# Patient Record
Sex: Male | Born: 1985 | Race: White | Hispanic: No | Marital: Married | Smoking: Current every day smoker
Health system: Southern US, Community
[De-identification: ages and names within clinical notes are randomized; demographics above are authoritative.]

## PROBLEM LIST (undated history)

## (undated) DIAGNOSIS — F419 Anxiety disorder, unspecified: Secondary | ICD-10-CM

## (undated) DIAGNOSIS — G252 Other specified forms of tremor: Secondary | ICD-10-CM

## (undated) DIAGNOSIS — K5792 Diverticulitis of intestine, part unspecified, without perforation or abscess without bleeding: Secondary | ICD-10-CM

## (undated) HISTORY — PX: DENTAL SURGERY: SHX609

---
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
## 2014-01-28 ENCOUNTER — Encounter (HOSPITAL_COMMUNITY): Payer: Self-pay | Admitting: Emergency Medicine

## 2014-01-28 ENCOUNTER — Emergency Department (HOSPITAL_COMMUNITY)
Admission: EM | Admit: 2014-01-28 | Discharge: 2014-01-28 | Disposition: A | Discharge status: Home / Self Care | Payer: PRIVATE HEALTH INSURANCE | Attending: Emergency Medicine | Admitting: Emergency Medicine

## 2014-01-28 DIAGNOSIS — F17200 Nicotine dependence, unspecified, uncomplicated: Secondary | ICD-10-CM | POA: Diagnosis not present

## 2014-01-28 DIAGNOSIS — K029 Dental caries, unspecified: Secondary | ICD-10-CM | POA: Diagnosis not present

## 2014-01-28 DIAGNOSIS — K089 Disorder of teeth and supporting structures, unspecified: Secondary | ICD-10-CM | POA: Diagnosis present

## 2014-01-28 DIAGNOSIS — Z8659 Personal history of other mental and behavioral disorders: Secondary | ICD-10-CM | POA: Insufficient documentation

## 2014-01-28 DIAGNOSIS — Z8669 Personal history of other diseases of the nervous system and sense organs: Secondary | ICD-10-CM | POA: Insufficient documentation

## 2014-01-28 DIAGNOSIS — K047 Periapical abscess without sinus: Secondary | ICD-10-CM

## 2014-01-28 HISTORY — DX: Anxiety disorder, unspecified: F41.9

## 2014-01-28 HISTORY — DX: Other specified forms of tremor: G25.2

## 2014-01-28 MED ORDER — CLINDAMYCIN HCL 150 MG PO CAPS: 150.0000 mg | ORAL_CAPSULE | Freq: Four times a day (QID) | ORAL | Status: DC

## 2014-01-28 MED ORDER — HYDROCODONE-ACETAMINOPHEN 5-325 MG PO TABS: 1.0000 | ORAL_TABLET | ORAL | Status: DC | PRN

## 2014-01-28 NOTE — ED Notes (Signed)
Dental pain for 2 days,lt upper wisdom tooth hurts

## 2014-01-28 NOTE — Discharge Instructions (Signed)
Complete your entire course of antibiotics as prescribed.  You  may use the hydrocodone for pain relief but do not drive within 4 hours of taking as this will make you drowsy.  Avoid applying heat or ice to this abscess area which can worsen your symptoms.  You may use warm salt water swish and spit treatment or half peroxide and water swish and spit after meals to keep this area clean as discussed.  Call the dentist listed above for further management of your symptoms.  

## 2014-01-28 NOTE — ED Provider Notes (Signed)
CSN: [id number]     Arrival date & time 01/28/14  1237 History   This chart was scribed for non-physician practitioner working with Burgess AmorJulie Taequan Stockhausen, by Tana ConchStephen Methvin ED Scribe. This patient was seen in [facility] and the patient's care was started at 3:51 PM.    Chief Complaint  Patient presents with  . Dental Pain     The history is provided by the patient. No language interpreter was used.    HPI Comments: Jake Walsh is a 28 y.o. male who presents to the Emergency Department complaining of a 2 day history of shooting dental pain caused by his upper left wisdom tooth. He states that the pain radiates up to his right temple. Both hot and cold cause him pain. Pt reports that his gums are swollen. He denies fever and chills. Pt states that NSAIDS have provided no effect. Current everyday smoker.  Past Medical History  Diagnosis Date  . Anxiety   . Coarse tremors    History reviewed. No pertinent past surgical history. History reviewed. No pertinent family history. History  Substance Use Topics  . Smoking status: Current Every Day Smoker  . Smokeless tobacco: Not on file  . Alcohol Use: No    Review of Systems  Constitutional: Negative for fever.  HENT: Positive for dental problem. Negative for facial swelling and sore throat.   Respiratory: Negative for shortness of breath.   Musculoskeletal: Negative for neck pain and neck stiffness.  All other systems reviewed and are negative.     Allergies  Review of patient's allergies indicates no known allergies.  Home Medications   Prior to Admission medications   Not on File   BP 144/72  Pulse 90  Temp(Src) 98 F (36.7 C) (Oral)  Resp 18  Ht 5\' 10"  (1.778 m)  Wt 180 lb (81.647 kg)  BMI 25.83 kg/m2  SpO2 99% Physical Exam  Nursing note and vitals reviewed. Constitutional: He is oriented to person, place, and time. He appears well-developed and well-nourished. No distress.  HENT:  Head: Normocephalic and  atraumatic.  Right Ear: Tympanic membrane and external ear normal.  Left Ear: Tympanic membrane and external ear normal.  Mouth/Throat: Oropharynx is clear and moist and mucous membranes are normal. No oral lesions.  Upper Left   Dental Infection Left upper molar has a large cavity, mild surrounding gingival edema. No fluctuance or drainage.  Eyes: Conjunctivae are normal.  Neck: Normal range of motion. Neck supple.  Cardiovascular: Normal rate and normal heart sounds.   Pulmonary/Chest: Effort normal.  Abdominal: He exhibits no distension.  Musculoskeletal: Normal range of motion.  Lymphadenopathy:    He has no cervical adenopathy.  Neurological: He is alert and oriented to person, place, and time.  Skin: Skin is warm and dry. No erythema.  Psychiatric: He has a normal mood and affect.    ED Course  Procedures (including critical care time)  DIAGNOSTIC STUDIES: Oxygen Saturation is 99% on RA, normal by my interpretation.    COORDINATION OF CARE:   4:00 PM-Discussed treatment plan which includes antibiotics and pain medication with pt at bedside and pt agreed to plan.   Labs Review Labs Reviewed - No data to display  Imaging Review No results found.   EKG Interpretation None      MDM   Final diagnoses:  Dental infection   I personally performed the services described in this documentation, which was scribed in my presence. The recorded information has been reviewed and is accurate.  Dental infection with probable early abscess.  Clindamycin, hydrocodone prescribed.  Dental referrals given.      Burgess AmorJulie Sheala Dosh, PA-C 01/30/14 1651

## 2014-01-31 NOTE — ED Provider Notes (Signed)
Medical screening examination/treatment/procedure(s) were performed by non-physician practitioner and as supervising physician I was immediately available for consultation/collaboration.   EKG Interpretation None        Benny LennertJoseph L Khan Chura, MD 01/31/14 [phone]

## 2020-07-04 ENCOUNTER — Encounter: Payer: Self-pay | Admitting: Emergency Medicine

## 2020-07-04 ENCOUNTER — Ambulatory Visit
Admission: EM | Admit: 2020-07-04 | Discharge: 2020-07-04 | Disposition: A | Discharge status: Home / Self Care | Payer: BC Managed Care – PPO | Attending: Emergency Medicine | Admitting: Emergency Medicine

## 2020-07-04 ENCOUNTER — Ambulatory Visit
Admission: RE | Admit: 2020-07-04 | Discharge: 2020-07-04 | Discharge status: Absent without leave | Payer: BC Managed Care – PPO | Source: Ambulatory Visit

## 2020-07-04 ENCOUNTER — Other Ambulatory Visit: Payer: Self-pay

## 2020-07-04 DIAGNOSIS — M778 Other enthesopathies, not elsewhere classified: Secondary | ICD-10-CM | POA: Diagnosis not present

## 2020-07-04 MED ORDER — ACETAMINOPHEN 500 MG PO TABS: 500.0000 mg | ORAL_TABLET | Freq: Four times a day (QID) | ORAL | 0 refills | Status: DC | PRN

## 2020-07-04 MED ORDER — PREDNISONE 10 MG PO TABS: 20.0000 mg | ORAL_TABLET | Freq: Every day | ORAL | 0 refills | Status: DC

## 2020-07-04 MED ORDER — PREDNISONE 10 MG (21) PO TBPK: ORAL_TABLET | ORAL | 0 refills | Status: DC

## 2020-07-04 NOTE — ED Triage Notes (Addendum)
RT hand pain and swelling that started x 2 days ago.  Denies any injury

## 2020-07-04 NOTE — ED Provider Notes (Signed)
[facility]   [id number] 07/04/20 Arrival Time: 1227   Chief Complaint  Patient presents with  . Hand Pain      SUBJECTIVE: History from: patient.  Jake Walsh is a 34 y.o. male who presents to the urgent care for complaint of right hand pain for the past 3 days. Denies any precipitating event, trauma or injury. Report he played video games where he excessively use his thumb on the controller. He localizes the pain to the right  thumb.  He describes the pain as constant and achy.  He has tried OTC medications without relief.  His symptoms are made worse with ROM.  He denies similar symptoms in the past. Denies chills, fever, nausea, vomiting, diarrhea  ROS: As per HPI.  All other pertinent ROS negative.     Past Medical History:  Diagnosis Date  . Anxiety   . Coarse tremors    History reviewed. No pertinent surgical history. No Known Allergies No current facility-administered medications on file prior to encounter.   Current Outpatient Medications on File Prior to Encounter  Medication Sig Dispense Refill  . clindamycin (CLEOCIN) 150 MG capsule Take 1 capsule (150 mg total) by mouth every 6 (six) hours. 28 capsule 0  . HYDROcodone-acetaminophen (NORCO/VICODIN) 5-325 MG per tablet Take 1 tablet by mouth every 4 (four) hours as needed for moderate pain. 15 tablet 0   Social History   Socioeconomic History  . Marital status: Single    Spouse name: Not on file  . Number of children: Not on file  . Years of education: Not on file  . Highest education level: Not on file  Occupational History  . Not on file  Tobacco Use  . Smoking status: Current Every Day Smoker    Packs/day: 0.50    Types: Cigarettes  . Smokeless tobacco: Never Used  Substance and Sexual Activity  . Alcohol use: No  . Drug use: No  . Sexual activity: Not on file  Other Topics Concern  . Not on file  Social History Narrative  . Not on file   Social Determinants of Health    Financial Resource Strain:   . Difficulty of Paying Living Expenses: Not on file  Food Insecurity:   . Worried About Programme researcher, broadcasting/film/video in the Last Year: Not on file  . Ran Out of Food in the Last Year: Not on file  Transportation Needs:   . Lack of Transportation (Medical): Not on file  . Lack of Transportation (Non-Medical): Not on file  Physical Activity:   . Days of Exercise per Week: Not on file  . Minutes of Exercise per Session: Not on file  Stress:   . Feeling of Stress : Not on file  Social Connections:   . Frequency of Communication with Friends and Family: Not on file  . Frequency of Social Gatherings with Friends and Family: Not on file  . Attends Religious Services: Not on file  . Active Member of Clubs or Organizations: Not on file  . Attends Banker Meetings: Not on file  . Marital Status: Not on file  Intimate Partner Violence:   . Fear of Current or Ex-Partner: Not on file  . Emotionally Abused: Not on file  . Physically Abused: Not on file  . Sexually Abused: Not on file   No family history on file.  OBJECTIVE:  Vitals:   07/04/20 1241  BP: 125/85  Pulse: 68  Resp: 17  Temp: 98.3 F (  36.8 C)  TempSrc: Oral  SpO2: 98%  Weight: 196 lb (88.9 kg)  Height: 5\' 10"  (1.778 m)     Physical Exam Vitals and nursing note reviewed.  Constitutional:      General: He is not in acute distress.    Appearance: Normal appearance. He is normal weight. He is not ill-appearing, toxic-appearing or diaphoretic.  Cardiovascular:     Rate and Rhythm: Normal rate and regular rhythm.     Pulses: Normal pulses.     Heart sounds: Normal heart sounds. No murmur heard.  No friction rub. No gallop.   Pulmonary:     Effort: Pulmonary effort is normal. No respiratory distress.     Breath sounds: Normal breath sounds. No stridor. No wheezing, rhonchi or rales.  Chest:     Chest wall: No tenderness.  Musculoskeletal:     Right hand: Tenderness present.      Left hand: Normal.     Comments: The right hand is without obvious asymmetry or deformity when compared to the left hand. No surface trauma, ecchymosis, open wound, laceration, warmth, nail avulsion, subungual hematoma present. Normal range of motion with pain. Neurovascular status intact.  Neurological:     Mental Status: He is alert and oriented to person, place, and time.     LABS:  No results found for this or any previous visit (from the past 24 hour(s)).   ASSESSMENT & PLAN:  1. Tendinitis of thumb     Meds ordered this encounter  Medications  . DISCONTD: predniSONE (DELTASONE) 10 MG tablet    Sig: Take 2 tablets (20 mg total) by mouth daily.    Dispense:  15 tablet    Refill:  0  . predniSONE (STERAPRED UNI-PAK 21 TAB) 10 MG (21) TBPK tablet    Sig: Take 6 tabs by mouth daily  for 1 days, then 5 tabs for 1 days, then 4 tabs for 1 days, then 3 tabs for 1 days, 2 tabs for 1 days, then 1 tab by mouth daily for 1 days    Dispense:  21 tablet    Refill:  0  . acetaminophen (TYLENOL) 500 MG tablet    Sig: Take 1 tablet (500 mg total) by mouth every 6 (six) hours as needed.    Dispense:  30 tablet    Refill:  0    Discharge instructions  Take Tylenol as prescribed for pain Prednisone was prescribed/take as directed Follow RICE instruction that is attached Use wrist splint Follow-up with PCP Return or go to ED for worsening of symptoms  Reviewed expectations re: course of current medical issues. Questions answered. Outlined signs and symptoms indicating need for more acute intervention. Patient verbalized understanding. After Visit Summary given.         , FNP 07/04/20 1323

## 2020-07-04 NOTE — Discharge Instructions (Signed)
Take Tylenol as prescribed for pain Prednisone was prescribed/take as directed Follow RICE instruction that is attached Use wrist splint Follow-up with PCP Return or go to ED for worsening of symptoms

## 2020-07-26 DIAGNOSIS — K0889 Other specified disorders of teeth and supporting structures: Secondary | ICD-10-CM | POA: Diagnosis not present

## 2020-07-26 DIAGNOSIS — Z6828 Body mass index (BMI) 28.0-28.9, adult: Secondary | ICD-10-CM | POA: Diagnosis not present

## 2020-07-28 DIAGNOSIS — R6889 Other general symptoms and signs: Secondary | ICD-10-CM | POA: Diagnosis not present

## 2020-07-28 DIAGNOSIS — K529 Noninfective gastroenteritis and colitis, unspecified: Secondary | ICD-10-CM | POA: Diagnosis not present

## 2020-07-28 DIAGNOSIS — B349 Viral infection, unspecified: Secondary | ICD-10-CM | POA: Diagnosis not present

## 2020-07-28 DIAGNOSIS — U071 COVID-19: Secondary | ICD-10-CM | POA: Diagnosis not present

## 2020-07-28 DIAGNOSIS — F17210 Nicotine dependence, cigarettes, uncomplicated: Secondary | ICD-10-CM | POA: Diagnosis not present

## 2020-10-30 DIAGNOSIS — Z23 Encounter for immunization: Secondary | ICD-10-CM | POA: Diagnosis not present

## 2020-11-07 ENCOUNTER — Ambulatory Visit (INDEPENDENT_AMBULATORY_CARE_PROVIDER_SITE_OTHER): Payer: BC Managed Care – PPO

## 2020-11-07 ENCOUNTER — Ambulatory Visit: Payer: BC Managed Care – PPO | Admitting: Nurse Practitioner

## 2020-11-07 ENCOUNTER — Other Ambulatory Visit: Payer: Self-pay

## 2020-11-07 ENCOUNTER — Encounter: Payer: Self-pay | Admitting: Nurse Practitioner

## 2020-11-07 VITALS — BP 121/71 | HR 86 | Temp 97.4°F | Ht 70.0 in | Wt 203.2 lb

## 2020-11-07 DIAGNOSIS — M25562 Pain in left knee: Secondary | ICD-10-CM

## 2020-11-07 MED ORDER — IBUPROFEN 600 MG PO TABS: 600.0000 mg | ORAL_TABLET | Freq: Three times a day (TID) | ORAL | 0 refills | Status: DC | PRN

## 2020-11-07 MED ORDER — PREDNISONE 10 MG (21) PO TBPK: ORAL_TABLET | ORAL | 0 refills | Status: DC

## 2020-11-07 NOTE — Assessment & Plan Note (Signed)
Patient is a 35 year old male who is in clinic today establishing care and reporting left knee pain.  Symptoms in the last 3 days have worsened.  Patient is not reporting loss of sensation tingling or numbness.  Patient describes pain as aching/shooting at times.  He rates pain 6 out of 10 on the pain scale of 0-10.  Patient refused Depo-Medrol shot in clinic, he is willing to start ibuprofen 600 mg as needed for pain, prednisone taper, brace on affected knee and ice pack. Advised patient to follow-up with worsening or unresolved symptoms.  The plan is to refer to physical therapy/orthopedic in the future. X-ray of the left knee completed results pending. Rx sent to pharmacy.

## 2020-11-07 NOTE — Patient Instructions (Addendum)
Ibuprofen for pain, prednisone pack taper, immobilize knee, ice compress. Plan is to follow-up with physical therapy if pain is worsened or unresolved.  Acute Knee Pain, Adult Many things can cause knee pain. Sometimes, knee pain is sudden (acute) and may be caused by damage, swelling, or irritation of the muscles and tissues that support your knee. The pain often goes away on its own with time and rest. If the pain does not go away, tests may be done to find out what is causing the pain. Follow these instructions at home: If you have a knee sleeve or brace:  Wear the knee sleeve or brace as told by your doctor. Take it off only as told by your doctor.  Loosen it if your toes: ? Tingle. ? Become numb. ? Turn cold and blue.  Keep it clean.  If the knee sleeve or brace is not waterproof: ? Do not let it get wet. ? Cover it with a watertight covering when you take a bath or shower.   Activity  Rest your knee.  Do not do things that cause pain or make pain worse.  Avoid activities where both feet leave the ground at the same time (high-impact activities). Examples are running, jumping rope, and doing jumping jacks.  Work with a physical therapist to make a safe exercise program, as told by your doctor. Managing pain, stiffness, and swelling  If told, put ice on the knee. To do this: ? If you have a removable knee sleeve or brace, take it off as told by your doctor. ? Put ice in a plastic bag. ? Place a towel between your skin and the bag. ? Leave the ice on for 20 minutes, 2-3 times a day. ? Take off the ice if your skin turns bright red. This is very important. If you cannot feel pain, heat, or cold, you have a greater risk of damage to the area.  If told, use an elastic bandage to put pressure (compression) on your injured knee.  Raise your knee above the level of your heart while you are sitting or lying down.  Sleep with a pillow under your knee.   General  instructions  Take over-the-counter and prescription medicines only as told by your doctor.  Do not smoke or use any products that contain nicotine or tobacco. If you need help quitting, ask your doctor.  If you are overweight, work with your doctor and a food expert (dietitian) to set goals to lose weight. Being overweight can make your knee hurt more.  Watch for any changes in your symptoms.  Keep all follow-up visits. Contact a doctor if:  The knee pain does not stop.  The knee pain changes or gets worse.  You have a fever along with knee pain.  Your knee is red or feels warm when you touch it.  Your knee gives out or locks up. Get help right away if:  Your knee swells, and the swelling gets worse.  You cannot move your knee.  You have very bad knee pain that does not get better with pain medicine. Summary  Many things can cause knee pain. The pain often goes away on its own with time and rest.  Your doctor may do tests to find out the cause of the pain.  Watch for any changes in your symptoms. Relieve your pain with rest, medicines, light activity, and use of ice.  Get help right away if you cannot move your knee or your knee  pain is very bad. This information is not intended to replace advice given to you by your health care provider. Make sure you discuss any questions you have with your health care provider. Document Revised: 03/12/2020 Document Reviewed: 03/12/2020 Elsevier Patient Education  2021 [employer].

## 2020-11-07 NOTE — Progress Notes (Signed)
New Patient Note  RE: Jake Walsh MRN: [id number] DOB: Aug 03, 1986 Date of Office Visit: 11/07/2020  Chief Complaint: Establish Care  History of Present Illness:  Knee Pain  The incident occurred 3 to 5 days ago. The incident occurred in the yard. There was no injury mechanism. The pain is present in the left knee. The pain is at a severity of 6/10. The pain has been constant since onset. Pertinent negatives include no loss of motion, loss of sensation, muscle weakness, numbness or tingling. He reports no foreign bodies present. The symptoms are aggravated by movement. He has tried nothing for the symptoms.    Assessment and Plan:   Jake Walsh is a 35 y.o. male with: Acute pain of left knee Patient is a 35 year old male who is in clinic today establishing care and reporting left knee pain.  Symptoms in the last 3 days have worsened.  Patient is not reporting loss of sensation tingling or numbness.  Patient describes pain as aching/shooting at times.  He rates pain 6 out of 10 on the pain scale of 0-10.  Patient refused Depo-Medrol shot in clinic, he is willing to start ibuprofen 600 mg as needed for pain, prednisone taper, brace on affected knee and ice pack. Advised patient to follow-up with worsening or unresolved symptoms.  The plan is to refer to physical therapy/orthopedic in the future. X-ray of the left knee completed results pending. Rx sent to pharmacy.  Return if symptoms worsen or fail to improve.   Diagnostics:   Past Medical History: Patient Active Problem List   Diagnosis Date Noted  . Acute pain of left knee 11/07/2020   Past Medical History:  Diagnosis Date  . Anxiety   . Coarse tremors    Past Surgical History: History reviewed. No pertinent surgical history. Medication List:  Current Outpatient Medications  Medication Sig Dispense Refill  . ibuprofen (ADVIL) 600 MG tablet Take 1 tablet (600 mg total) by mouth every 8 (eight) hours as needed. 30 tablet 0   . predniSONE (STERAPRED UNI-PAK 21 TAB) 10 MG (21) TBPK tablet 6 tablets day 1, 5 tablets day 2, 4 tablets day 3, 3 tablets day , 2 tablets day 5, 1 tablet day 6 21 tablet 0   No current facility-administered medications for this visit.   Allergies: No Known Allergies Social History: Social History   Socioeconomic History  . Marital status: Single    Spouse name: Enrique Sack  . Number of children: 3  . Years of education: Not on file  . Highest education level: Some college, no degree  Occupational History  . Not on file  Tobacco Use  . Smoking status: Current Every Day Smoker    Packs/day: 0.25    Types: Cigarettes  . Smokeless tobacco: Never Used  Vaping Use  . Vaping Use: Every day  . Substances: Nicotine  Substance and Sexual Activity  . Alcohol use: No  . Drug use: No  . Sexual activity: Yes  Other Topics Concern  . Not on file  Social History Narrative  . Not on file   Social Determinants of Health   Financial Resource Strain: Not on file  Food Insecurity: Not on file  Transportation Needs: Not on file  Physical Activity: Not on file  Stress: Not on file  Social Connections: Not on file       Family History: Family History  Problem Relation Age of Onset  . Arthritis/Rheumatoid Mother   . Gout Mother   . Parkinson's  disease Father   . Diabetes Father   . Bipolar disorder Father   . Gallstones Sister   . Hodgkin's lymphoma Sister   . ADD / ADHD Son   . ODD Son          Review of Systems  Constitutional: Negative.   HENT: Negative.   Respiratory: Negative.   Cardiovascular: Negative.   Musculoskeletal:       Acute left knee pain  Skin: Negative.   Neurological: Negative for tingling, numbness and headaches.  All other systems reviewed and are negative.  Objective: BP 121/71   Pulse 86   Temp (!) 97.4 F (36.3 C)   Ht 5\' 10"  (1.778 m)   Wt 203 lb 3.2 oz (92.2 kg)   SpO2 100%   BMI 29.16 kg/m  Body mass index is 29.16 kg/m. Physical  Exam Vitals reviewed.  Constitutional:      Appearance: Normal appearance.  HENT:     Head: Normocephalic.     Nose: Nose normal.  Eyes:     Conjunctiva/sclera: Conjunctivae normal.  Cardiovascular:     Rate and Rhythm: Normal rate and regular rhythm.     Pulses: Normal pulses.     Heart sounds: Normal heart sounds.  Pulmonary:     Effort: Pulmonary effort is normal.     Breath sounds: Normal breath sounds.  Musculoskeletal:        General: Tenderness present.     Comments: Limited range of motion left knee.  Skin:    General: Skin is warm.  Neurological:     Mental Status: He is alert and oriented to person, place, and time.  Psychiatric:        Behavior: Behavior normal.    The plan was reviewed with the patient/family, and all questions/concerned were addressed.  It was my pleasure to see Jake Walsh today and participate in his care. Please feel free to contact me with any questions or concerns.  Sincerely,  Chrissie Noa NP [facility]

## 2020-11-28 DIAGNOSIS — Z23 Encounter for immunization: Secondary | ICD-10-CM | POA: Diagnosis not present

## 2021-02-06 ENCOUNTER — Encounter: Payer: Self-pay | Admitting: Nurse Practitioner

## 2021-02-06 ENCOUNTER — Other Ambulatory Visit: Payer: Self-pay

## 2021-02-06 ENCOUNTER — Ambulatory Visit (INDEPENDENT_AMBULATORY_CARE_PROVIDER_SITE_OTHER): Payer: BC Managed Care – PPO | Admitting: Nurse Practitioner

## 2021-02-06 VITALS — BP 135/87 | HR 84 | Temp 98.5°F | Ht 70.0 in | Wt 211.0 lb

## 2021-02-06 DIAGNOSIS — Z0001 Encounter for general adult medical examination with abnormal findings: Secondary | ICD-10-CM | POA: Diagnosis not present

## 2021-02-06 DIAGNOSIS — Z0000 Encounter for general adult medical examination without abnormal findings: Secondary | ICD-10-CM | POA: Insufficient documentation

## 2021-02-06 DIAGNOSIS — F17200 Nicotine dependence, unspecified, uncomplicated: Secondary | ICD-10-CM | POA: Insufficient documentation

## 2021-02-06 NOTE — Assessment & Plan Note (Signed)
Provided education for smoking cessation.  Patient is not ready to quit but will let provider know when he is ready.  Printed handouts given.

## 2021-02-06 NOTE — Patient Instructions (Signed)

## 2021-02-06 NOTE — Progress Notes (Signed)
Established Patient Office Visit  Subjective:  Patient ID: Jake Walsh, male    DOB: [birth year]  Age: 35 y.o. MRN: [id number]  CC:  Chief Complaint  Patient presents with  . Annual Exam  . Nicotine Dependence    HPI Jake Walsh presents for .   Encounter for general adult medical examination without abnormal findings  Physical: Patient's last physical exam was 2 year ago .  Weight: Appropriate for height (BMI greater than 27%) ;  Blood Pressure: Normal (BP less than 120/80) ; 135/87  Medical History: Patient history reviewed ; Family history reviewed ;  Allergies Reviewed: No change in current allergies ;  Medications Reviewed: Medications reviewed - no changes ;  Lipids: Normal lipid levels ; labs completed results pending Smoking: Life-long-smoker ; yes Physical Activity: Exercises at least 3 times per week ; waking at work Alcohol/Drug Use: alcohol once a while-drinker ; No illicit drug use ;  No   Safety: reviewed ; Patient wears a seat belt, has smoke detectors, has carbon monoxide detectors, practices appropriate gun safety, and wears sunscreen with extended sun exposure. No Dental Care: biannual cleanings,No  brushes and flosses daily. Yes Ophthalmology/Optometry: Annual visit.  Hearing loss: none Vision impairments: none  Past Medical History:  Diagnosis Date  . Anxiety   . Coarse tremors     History reviewed. No pertinent surgical history.  Family History  Problem Relation Age of Onset  . Arthritis/Rheumatoid Mother   . Gout Mother   . Arthritis Mother   . Parkinson's disease Father   . Diabetes Father   . Bipolar disorder Father   . Gallstones Sister   . Hodgkin's lymphoma Sister   . ADD / ADHD Son   . ODD Son     Social History   Socioeconomic History  . Marital status: Single    Spouse name: Jake Walsh  . Number of children: 3  . Years of education: Not on file  . Highest education level: Some college, no degree  Occupational History  .  Not on file  Tobacco Use  . Smoking status: Current Every Day Smoker    Packs/day: 0.25    Types: Cigarettes  . Smokeless tobacco: Never Used  Vaping Use  . Vaping Use: Every day  . Substances: Nicotine  Substance and Sexual Activity  . Alcohol use: No    Comment: occ  . Drug use: No  . Sexual activity: Yes    Birth control/protection: None  Other Topics Concern  . Not on file  Social History Narrative  . Not on file   Social Determinants of Health   Financial Resource Strain: Not on file  Food Insecurity: Not on file  Transportation Needs: Not on file  Physical Activity: Not on file  Stress: Not on file  Social Connections: Not on file  Intimate Partner Violence: Not on file    Outpatient Medications Prior to Visit  Medication Sig Dispense Refill  . ibuprofen (ADVIL) 600 MG tablet Take 1 tablet (600 mg total) by mouth every 8 (eight) hours as needed. 30 tablet 0  . predniSONE (STERAPRED UNI-PAK 21 TAB) 10 MG (21) TBPK tablet 6 tablets day 1, 5 tablets day 2, 4 tablets day 3, 3 tablets day , 2 tablets day 5, 1 tablet day 6 21 tablet 0   No facility-administered medications prior to visit.    No Known Allergies  ROS Review of Systems  Constitutional: Negative.   HENT: Negative.   Eyes: Negative.  Respiratory: Negative.   Cardiovascular: Negative.   Gastrointestinal: Negative.   Genitourinary: Negative.   Musculoskeletal: Negative.   Skin: Negative.   Neurological: Negative.   Psychiatric/Behavioral: Negative.   All other systems reviewed and are negative.     Objective:    Physical Exam Vitals and nursing note reviewed.  Constitutional:      Appearance: Normal appearance.  HENT:     Head: Normocephalic.     Right Ear: Ear canal and external ear normal. There is impacted cerumen.     Left Ear: Ear canal and external ear normal. There is impacted cerumen.     Nose: Nose normal.     Mouth/Throat:     Mouth: Mucous membranes are moist.     Pharynx:  Oropharynx is clear. No oropharyngeal exudate.  Eyes:     Conjunctiva/sclera: Conjunctivae normal.     Pupils: Pupils are equal, round, and reactive to light.  Cardiovascular:     Rate and Rhythm: Normal rate and regular rhythm.     Pulses: Normal pulses.     Heart sounds: Normal heart sounds.  Pulmonary:     Effort: Pulmonary effort is normal.     Breath sounds: Normal breath sounds.  Abdominal:     General: Bowel sounds are normal.  Musculoskeletal:     Cervical back: Normal range of motion.  Skin:    General: Skin is warm.     Findings: No rash.  Neurological:     Mental Status: He is alert and oriented to person, place, and time.  Psychiatric:        Mood and Affect: Mood is anxious.        Behavior: Behavior normal.        Thought Content: Thought content does not include suicidal ideation.     BP 135/87   Pulse 84   Temp 98.5 F (36.9 C) (Temporal)   Ht 5\' 10"  (1.778 m)   Wt 211 lb (95.7 kg)   SpO2 97%   BMI 30.28 kg/m  Wt Readings from Last 3 Encounters:  02/06/21 211 lb (95.7 kg)  11/07/20 203 lb 3.2 oz (92.2 kg)  07/04/20 196 lb (88.9 kg)     Health Maintenance Due  Topic Date Due  . Hepatitis C Screening  Never done  . HIV Screening  Never done  . TETANUS/TDAP  Never done  . COVID-19 Vaccine (2 - Pfizer 3-dose series) 11/21/2019    Flowsheet Row Office Visit from 02/06/2021 in 02/08/2021 Family Medicine  PHQ-9 Total Score 0     GAD 7 : Generalized Anxiety Score 02/06/2021  Nervous, Anxious, on Edge 1  Control/stop worrying 0  Worry too much - different things 0  Trouble relaxing 2  Restless 2  Easily annoyed or irritable 1  Afraid - awful might happen 0  Total GAD 7 Score 6      Assessment & Plan:   Problem List Items Addressed This Visit      Other   Annual physical exam - Primary    Completed head to toe assessment.  Provided education for preventative and health management.  Labs completed today CBC, CMP, HIV antibody,  hepatitis C antibody and lipid panel.  Results pending.   Follow-up in 1 year      Relevant Orders   CBC with Differential   Comprehensive metabolic panel   Lipid Panel   Hepatitis C antibody   HIV Antibody (routine testing w rflx)      No  orders of the defined types were placed in this encounter.   Follow-up: Return in about 1 year (around 02/06/2022).    Daryll Drown, NP

## 2021-02-06 NOTE — Assessment & Plan Note (Addendum)
Completed head to toe assessment.  Provided education for preventative and health management.  Labs completed today CBC, CMP, HIV antibody, hepatitis C antibody and lipid panel.  Results pending.   Follow-up in 1 year

## 2021-02-11 ENCOUNTER — Other Ambulatory Visit: Payer: Self-pay

## 2021-02-11 ENCOUNTER — Other Ambulatory Visit: Payer: BC Managed Care – PPO

## 2021-02-11 DIAGNOSIS — Z0000 Encounter for general adult medical examination without abnormal findings: Secondary | ICD-10-CM | POA: Diagnosis not present

## 2021-02-11 DIAGNOSIS — Z136 Encounter for screening for cardiovascular disorders: Secondary | ICD-10-CM | POA: Diagnosis not present

## 2021-02-11 LAB — CBC WITH DIFFERENTIAL/PLATELET
MCHC: 33.5 g/dL (ref 31.5–35.7)
Neutrophils Absolute: 4.5 10*3/uL (ref 1.4–7.0)
Platelets: 214 10*3/uL (ref 150–450)

## 2021-02-11 LAB — COMPREHENSIVE METABOLIC PANEL
Calcium: 9.7 mg/dL (ref 8.7–10.2)
Sodium: 141 mmol/L (ref 134–144)

## 2021-02-11 LAB — HEPATITIS C ANTIBODY

## 2021-02-11 LAB — LIPID PANEL

## 2021-02-12 LAB — CBC WITH DIFFERENTIAL/PLATELET
Basophils Absolute: 0.1 10*3/uL (ref 0.0–0.2)
Basos: 1 %
EOS (ABSOLUTE): 0.4 10*3/uL (ref 0.0–0.4)
Eos: 4 %
Hematocrit: 44.5 % (ref 37.5–51.0)
Hemoglobin: 14.9 g/dL (ref 13.0–17.7)
Immature Grans (Abs): 0 10*3/uL (ref 0.0–0.1)
Immature Granulocytes: 0 %
Lymphocytes Absolute: 2.6 10*3/uL (ref 0.7–3.1)
Lymphs: 32 %
MCH: 28.5 pg (ref 26.6–33.0)
MCV: 85 fL (ref 79–97)
Monocytes Absolute: 0.7 10*3/uL (ref 0.1–0.9)
Monocytes: 9 %
Neutrophils: 54 %
RBC: 5.23 x10E6/uL (ref 4.14–5.80)
RDW: 13.1 % (ref 11.6–15.4)
WBC: 8.3 10*3/uL (ref 3.4–10.8)

## 2021-02-12 LAB — COMPREHENSIVE METABOLIC PANEL
ALT: 19 IU/L (ref 0–44)
AST: 17 IU/L (ref 0–40)
Albumin/Globulin Ratio: 2.2 (ref 1.2–2.2)
Albumin: 4.7 g/dL (ref 4.0–5.0)
Alkaline Phosphatase: 73 IU/L (ref 44–121)
BUN: 11 mg/dL (ref 6–20)
Bilirubin Total: 0.5 mg/dL (ref 0.0–1.2)
CO2: 25 mmol/L (ref 20–29)
Chloride: 103 mmol/L (ref 96–106)
Creatinine, Ser: 0.83 mg/dL (ref 0.76–1.27)
Globulin, Total: 2.1 g/dL (ref 1.5–4.5)
Glucose: 98 mg/dL (ref 65–99)
Potassium: 5.2 mmol/L (ref 3.5–5.2)
Total Protein: 6.8 g/dL (ref 6.0–8.5)
eGFR: 119 mL/min/{1.73_m2} (ref >59)

## 2021-02-12 LAB — LIPID PANEL
Chol/HDL Ratio: 3.9 ratio (ref 0.0–5.0)
Cholesterol, Total: 193 mg/dL (ref 100–199)
LDL Chol Calc (NIH): 111 mg/dL — ABNORMAL HIGH (ref 0–99)
Triglycerides: 181 mg/dL — ABNORMAL HIGH (ref 0–149)
VLDL Cholesterol Cal: 32 mg/dL (ref 5–40)

## 2021-02-12 LAB — HIV ANTIBODY (ROUTINE TESTING W REFLEX): HIV Screen 4th Generation wRfx: NONREACTIVE

## 2021-03-13 ENCOUNTER — Ambulatory Visit: Payer: BC Managed Care – PPO | Admitting: Family Medicine

## 2021-03-13 ENCOUNTER — Other Ambulatory Visit: Payer: Self-pay

## 2021-03-13 ENCOUNTER — Encounter: Payer: Self-pay | Admitting: Family Medicine

## 2021-03-13 VITALS — BP 128/81 | HR 83 | Temp 98.2°F | Ht 70.0 in | Wt 216.0 lb

## 2021-03-13 DIAGNOSIS — S99921A Unspecified injury of right foot, initial encounter: Secondary | ICD-10-CM

## 2021-03-13 DIAGNOSIS — R1319 Other dysphagia: Secondary | ICD-10-CM

## 2021-03-13 MED ORDER — OMEPRAZOLE 20 MG PO CPDR: 20.0000 mg | DELAYED_RELEASE_CAPSULE | Freq: Every day | ORAL | 3 refills | Status: DC

## 2021-03-13 NOTE — Progress Notes (Signed)
Assessment & Plan:  1. Esophageal dysphagia Started omeprazole. Discussed if symptoms do not resolve that he may want to see a gastroenterologist.  - omeprazole (PRILOSEC) 20 MG capsule; Take 1 capsule (20 mg total) by mouth daily.  Dispense: 30 capsule; Refill: 3  2. Injury of right foot, initial encounter Reassurance provided that his foot laceration is only superficial and there is not s/s of infection. Keep clean and dry. Advised to cover if wearing shoes that leave area exposed.    Follow up plan: Return if symptoms worsen or fail to improve.  Deliah Boston, MSN, APRN, FNP-C [facility]  Subjective:   Patient ID: Jake Walsh, male    DOB: [birth year], 35 y.o.   MRN: [id number]  HPI: Jake Walsh is a 35 y.o. male presenting on 03/13/2021 for trouble swallowing  (X 2 days when he tries to swallow food it feels like it gets stuck ) and Fall (Patient had a fall in the shower x 1 week ago and stepped on a nail )  Patient reports for the past two days when he swallows, food is getting stuck in his throat. He chews food thoroughly. Denies reflux, heartburn, and burping. No trouble breathing. He has not experienced this before.   Patient would also like his right foot looked at (per wife's request) as he fell in the shower a week ago and hit his heel on the screw from the drain.   ROS: Negative unless specifically indicated above in HPI.   Relevant past medical history reviewed and updated as indicated.   Allergies and medications reviewed and updated.  No current outpatient medications on file.  No Known Allergies  Objective:   BP 128/81   Pulse 83   Temp 98.2 F (36.8 C) (Temporal)   Ht 5\' 10"  (1.778 m)   Wt 216 lb (98 kg)   SpO2 98%   BMI 30.99 kg/m    Physical Exam Vitals reviewed.  Constitutional:      General: He is not in acute distress.    Appearance: Normal appearance. He is not ill-appearing, toxic-appearing or diaphoretic.   HENT:     Head: Normocephalic and atraumatic.     Mouth/Throat:     Mouth: Mucous membranes are moist. No injury, lacerations, oral lesions or angioedema.     Palate: No mass and lesions.     Pharynx: Oropharynx is clear.  Eyes:     General: No scleral icterus.       Right eye: No discharge.        Left eye: No discharge.     Conjunctiva/sclera: Conjunctivae normal.  Neck:     Thyroid: No thyroid mass or thyromegaly.  Cardiovascular:     Rate and Rhythm: Normal rate.  Pulmonary:     Effort: Pulmonary effort is normal. No respiratory distress.  Musculoskeletal:        General: Normal range of motion.     Cervical back: Normal range of motion.  Lymphadenopathy:     Cervical: No cervical adenopathy.  Skin:    General: Skin is warm and dry.     Comments: Right foot laceration only extend through layers of skin. No erythema, warmth, or drainage.   Neurological:     Mental Status: He is alert and oriented to person, place, and time. Mental status is at baseline.  Psychiatric:        Mood and Affect: Mood normal.        Behavior: Behavior  normal.        Thought Content: Thought content normal.        Judgment: Judgment normal.

## 2021-03-16 ENCOUNTER — Encounter: Payer: Self-pay | Admitting: Family Medicine

## 2021-05-22 ENCOUNTER — Ambulatory Visit: Payer: BC Managed Care – PPO | Admitting: Family Medicine

## 2021-05-22 ENCOUNTER — Encounter: Payer: Self-pay | Admitting: Family Medicine

## 2021-05-22 ENCOUNTER — Other Ambulatory Visit: Payer: Self-pay

## 2021-05-22 VITALS — BP 115/74 | HR 75 | Temp 97.8°F | Ht 70.0 in | Wt 207.5 lb

## 2021-05-22 DIAGNOSIS — H60503 Unspecified acute noninfective otitis externa, bilateral: Secondary | ICD-10-CM | POA: Diagnosis not present

## 2021-05-22 DIAGNOSIS — J029 Acute pharyngitis, unspecified: Secondary | ICD-10-CM | POA: Diagnosis not present

## 2021-05-22 LAB — CULTURE, GROUP A STREP

## 2021-05-22 LAB — RAPID STREP SCREEN (MED CTR MEBANE ONLY): Strep Gp A Ag, IA W/Reflex: NEGATIVE

## 2021-05-22 MED ORDER — CIPROFLOXACIN-DEXAMETHASONE 0.3-0.1 % OT SUSP: 4.0000 [drp] | Freq: Two times a day (BID) | OTIC | 0 refills | Status: DC

## 2021-05-22 NOTE — Patient Instructions (Signed)
Otitis Externa  Otitis externa is an infection of the outer ear canal. The outer ear canal is the area between the outside of the ear and the eardrum. Otitis externa issometimes called swimmer's ear. What are the causes? Common causes of this condition include: Swimming in dirty water. Moisture in the ear. An injury to the inside of the ear. An object stuck in the ear. A cut or scrape on the outside of the ear. What increases the risk? You are more likely to develop this condition if you go swimming often. What are the signs or symptoms? The first symptom of this condition is often itching in the ear. Later symptoms of the condition include: Swelling of the ear. Redness in the ear. Ear pain. The pain may get worse when you pull on your ear. Pus coming from the ear. How is this diagnosed? This condition may be diagnosed by examining the ear and testing fluid from theear for bacteria and funguses. How is this treated? This condition may be treated with: Antibiotic ear drops. These are often given for 10-14 days. Medicines to reduce itching and swelling. Follow these instructions at home: If you were prescribed antibiotic ear drops, use them as told by your health care provider. Do not stop using the antibiotic even if your condition improves. Take over-the-counter and prescription medicines only as told by your health care provider. Avoid getting water in your ears as told by your health care provider. This may include avoiding swimming or water sports for a few days. Keep all follow-up visits as told by your health care provider. This is important. How is this prevented? Keep your ears dry. Use the corner of a towel to dry your ears after you swim or bathe. Avoid scratching or putting things in your ear. Doing these things can damage the ear canal or remove the protective wax that lines it, which makes it easier for bacteria and funguses to grow. Avoid swimming in lakes, polluted  water, or pools that may not have enough chlorine. Contact a health care provider if: You have a fever. Your ear is still red, swollen, painful, or draining pus after 3 days. Your redness, swelling, or pain gets worse. You have a severe headache. You have redness, swelling, pain, or tenderness in the area behind your ear. Summary Otitis externa is an infection of the outer ear canal. Common causes include swimming in dirty water, moisture in the ear, or a cut or scrape in the ear. Symptoms include pain, redness, and swelling of the ear. If you were prescribed antibiotic ear drops, use them as told by your health care provider. Do not stop using the antibiotic even if your condition improves. This information is not intended to replace advice given to you by your health care provider. Make sure you discuss any questions you have with your healthcare provider. Document Revised: 03/02/2018 Document Reviewed: 03/03/2018 Elsevier Patient Education  2022 [employer]vier Inc[employer]  

## 2021-05-22 NOTE — Progress Notes (Signed)
Acute Office Visit  Subjective:    Patient ID: Jake Walsh, male    DOB: 12/09/1985, 35 y.o.   MRN: [id number]  Chief Complaint  Patient presents with   Sore Throat    HPI Patient is in today for a sore throat x 1 day. The pain is on the left side of his throat. His grandfather currently has strep throat. Denies fever, HA, cough, congestion, body aches, or chills. He does reports an earache of his left ear. He wears ear plugs at work.   Past Medical History:  Diagnosis Date   Anxiety    Coarse tremors     No past surgical history on file.  Family History  Problem Relation Age of Onset   Arthritis/Rheumatoid Mother    Gout Mother    Arthritis Mother    Parkinson's disease Father    Diabetes Father    Bipolar disorder Father    Gallstones Sister    Hodgkin's lymphoma Sister    ADD / ADHD Son    ODD Son     Social History   Socioeconomic History   Marital status: Married    Spouse name: Tillie Rung   Number of children: 3   Years of education: Not on file   Highest education level: Some college, no degree  Occupational History   Not on file  Tobacco Use   Smoking status: Every Day    Packs/day: 0.25    Types: Cigarettes   Smokeless tobacco: Never  Vaping Use   Vaping Use: Every day   Substances: Nicotine  Substance and Sexual Activity   Alcohol use: No    Comment: occ   Drug use: No   Sexual activity: Yes    Birth control/protection: None  Other Topics Concern   Not on file  Social History Narrative   Not on file   Social Determinants of Health   Financial Resource Strain: Not on file  Food Insecurity: Not on file  Transportation Needs: Not on file  Physical Activity: Not on file  Stress: Not on file  Social Connections: Not on file  Intimate Partner Violence: Not on file    Outpatient Medications Prior to Visit  Medication Sig Dispense Refill   naproxen sodium (ALEVE) 220 MG tablet Take 220 mg by mouth.     omeprazole (PRILOSEC) 20 MG  capsule Take 1 capsule (20 mg total) by mouth daily. 30 capsule 3   No facility-administered medications prior to visit.    No Known Allergies  Review of Systems As per HPI.     Objective:    Physical Exam Vitals and nursing note reviewed.  Constitutional:      General: He is not in acute distress.    Appearance: He is not ill-appearing, toxic-appearing or diaphoretic.  HENT:     Right Ear: Swelling (canal) and tenderness present. No drainage.     Left Ear: Swelling (canal) and tenderness present. No drainage.     Nose: No congestion.     Mouth/Throat:     Mouth: Mucous membranes are moist.     Pharynx: Oropharynx is clear.     Tonsils: No tonsillar exudate or tonsillar abscesses. 0 on the right. 0 on the left.  Eyes:     Conjunctiva/sclera: Conjunctivae normal.  Pulmonary:     Effort: Pulmonary effort is normal. No respiratory distress.  Musculoskeletal:     Cervical back: Neck supple.  Skin:    General: Skin is warm and dry.  Neurological:  General: No focal deficit present.     Mental Status: He is alert and oriented to person, place, and time.  Psychiatric:        Mood and Affect: Mood normal.        Behavior: Behavior normal.    BP 115/74   Pulse 75   Temp 97.8 F (36.6 C) (Temporal)   Ht 5' 10"  (1.778 m)   Wt 207 lb 8 oz (94.1 kg)   BMI 29.77 kg/m  Wt Readings from Last 3 Encounters:  05/22/21 207 lb 8 oz (94.1 kg)  03/13/21 216 lb (98 kg)  02/06/21 211 lb (95.7 kg)    Health Maintenance Due  Topic Date Due   Pneumococcal Vaccine 47-104 Years old (1 - PCV) Never done   COVID-19 Vaccine (2 - Pfizer series) 11/21/2019   INFLUENZA VACCINE  05/11/2021    There are no preventive care reminders to display for this patient.   No results found for: TSH Lab Results  Component Value Date   WBC 8.3 02/11/2021   HGB 14.9 02/11/2021   HCT 44.5 02/11/2021   MCV 85 02/11/2021   PLT 214 02/11/2021   Lab Results  Component Value Date   NA 141  02/11/2021   K 5.2 02/11/2021   CO2 25 02/11/2021   GLUCOSE 98 02/11/2021   BUN 11 02/11/2021   CREATININE 0.83 02/11/2021   BILITOT 0.5 02/11/2021   ALKPHOS 73 02/11/2021   AST 17 02/11/2021   ALT 19 02/11/2021   PROT 6.8 02/11/2021   ALBUMIN 4.7 02/11/2021   CALCIUM 9.7 02/11/2021   EGFR 119 02/11/2021   Lab Results  Component Value Date   CHOL 193 02/11/2021   Lab Results  Component Value Date   HDL 50 02/11/2021   Lab Results  Component Value Date   LDLCALC 111 (H) 02/11/2021   Lab Results  Component Value Date   TRIG 181 (H) 02/11/2021   Lab Results  Component Value Date   CHOLHDL 3.9 02/11/2021   No results found for: HGBA1C     Assessment & Plan:   Ishaq was seen today for sore throat.  Diagnoses and all orders for this visit:  Sore throat Benign exam, rapid strep negative. Discussed symptomatic care.  -     Rapid Strep Screen ([facility] ONLY)  Acute otitis externa of both ears, unspecified type Tenderness and swelling of bilateral canals. Ciprodex as below. Handout given.  -     ciprofloxacin-dexamethasone (CIPRODEX) OTIC suspension; Place 4 drops into both ears 2 (two) times daily.  Return to office for new or worsening symptoms, or if symptoms persist.   The patient indicates understanding of these issues and agrees with the plan.  Gwenlyn Perking, FNP

## 2021-08-12 ENCOUNTER — Other Ambulatory Visit: Payer: Self-pay

## 2021-08-12 ENCOUNTER — Encounter: Payer: Self-pay | Admitting: Nurse Practitioner

## 2021-08-12 ENCOUNTER — Ambulatory Visit: Payer: BC Managed Care – PPO | Admitting: Nurse Practitioner

## 2021-08-12 VITALS — BP 125/85 | HR 84 | Temp 97.8°F | Ht 70.0 in | Wt 206.2 lb

## 2021-08-12 DIAGNOSIS — Z3009 Encounter for other general counseling and advice on contraception: Secondary | ICD-10-CM | POA: Diagnosis not present

## 2021-08-12 NOTE — Assessment & Plan Note (Signed)
Referral to neurology is completed.  Education provided to patient on procedure.

## 2021-08-12 NOTE — Progress Notes (Signed)
Acute Office Visit  Subjective:    Patient ID: Jake Walsh, male    DOB: 1986-07-16, 35 y.o.   MRN: [id number]  Chief Complaint  Patient presents with   Referral    Vasectomy     HPI Patient is in today for referral for vasectomy for birth control, He currently has 5 children and has no desire for ore children. no other complains. Education provided, referral completed. Printed hand out given  Past Medical History:  Diagnosis Date   Anxiety    Coarse tremors     History reviewed. No pertinent surgical history.  Family History  Problem Relation Age of Onset   Arthritis/Rheumatoid Mother    Gout Mother    Arthritis Mother    Parkinson's disease Father    Diabetes Father    Bipolar disorder Father    Gallstones Sister    Hodgkin's lymphoma Sister    ADD / ADHD Son    ODD Son     Social History   Socioeconomic History   Marital status: Married    Spouse name: Tillie Rung   Number of children: 3   Years of education: Not on file   Highest education level: Some college, no degree  Occupational History   Not on file  Tobacco Use   Smoking status: Every Day    Packs/day: 0.25    Types: Cigarettes   Smokeless tobacco: Never  Vaping Use   Vaping Use: Every day   Substances: Nicotine  Substance and Sexual Activity   Alcohol use: No    Comment: occ   Drug use: No   Sexual activity: Yes    Birth control/protection: None  Other Topics Concern   Not on file  Social History Narrative   Not on file   Social Determinants of Health   Financial Resource Strain: Not on file  Food Insecurity: Not on file  Transportation Needs: Not on file  Physical Activity: Not on file  Stress: Not on file  Social Connections: Not on file  Intimate Partner Violence: Not on file    Outpatient Medications Prior to Visit  Medication Sig Dispense Refill   omeprazole (PRILOSEC) 20 MG capsule Take 1 capsule (20 mg total) by mouth daily. 30 capsule 3   naproxen sodium (ALEVE) 220  MG tablet Take 220 mg by mouth. (Patient not taking: Reported on 08/12/2021)     ciprofloxacin-dexamethasone (CIPRODEX) OTIC suspension Place 4 drops into both ears 2 (two) times daily. 7.5 mL 0   No facility-administered medications prior to visit.    No Known Allergies  Review of Systems  Constitutional: Negative.   HENT: Negative.    Eyes: Negative.   Respiratory: Negative.    Cardiovascular: Negative.   Gastrointestinal: Negative.   Skin: Negative.   All other systems reviewed and are negative.     Objective:    Physical Exam Vitals and nursing note reviewed.  Constitutional:      Appearance: Normal appearance.  HENT:     Head: Normocephalic.     Right Ear: Ear canal and external ear normal.     Left Ear: Ear canal and external ear normal.  Eyes:     Conjunctiva/sclera: Conjunctivae normal.  Cardiovascular:     Rate and Rhythm: Normal rate and regular rhythm.  Pulmonary:     Effort: Pulmonary effort is normal.     Breath sounds: Normal breath sounds.  Abdominal:     General: Bowel sounds are normal.  Skin:  General: Skin is warm.     Findings: No rash.  Neurological:     Mental Status: He is alert.    BP 125/85   Pulse 84   Temp 97.8 F (36.6 C) (Temporal)   Ht 5' 10" (1.778 m)   Wt 206 lb 3.2 oz (93.5 kg)   BMI 29.59 kg/m  Wt Readings from Last 3 Encounters:  08/12/21 206 lb 3.2 oz (93.5 kg)  05/22/21 207 lb 8 oz (94.1 kg)  03/13/21 216 lb (98 kg)    Health Maintenance Due  Topic Date Due   Pneumococcal Vaccine 19-64 Years old (1 - PCV) Never done   COVID-19 Vaccine (2 - Pfizer series) 11/21/2019   INFLUENZA VACCINE  Never done    There are no preventive care reminders to display for this patient.   No results found for: TSH Lab Results  Component Value Date   WBC 8.3 02/11/2021   HGB 14.9 02/11/2021   HCT 44.5 02/11/2021   MCV 85 02/11/2021   PLT 214 02/11/2021   Lab Results  Component Value Date   NA 141 02/11/2021   K 5.2  02/11/2021   CO2 25 02/11/2021   GLUCOSE 98 02/11/2021   BUN 11 02/11/2021   CREATININE 0.83 02/11/2021   BILITOT 0.5 02/11/2021   ALKPHOS 73 02/11/2021   AST 17 02/11/2021   ALT 19 02/11/2021   PROT 6.8 02/11/2021   ALBUMIN 4.7 02/11/2021   CALCIUM 9.7 02/11/2021   EGFR 119 02/11/2021   Lab Results  Component Value Date   CHOL 193 02/11/2021   Lab Results  Component Value Date   HDL 50 02/11/2021   Lab Results  Component Value Date   LDLCALC 111 (H) 02/11/2021   Lab Results  Component Value Date   TRIG 181 (H) 02/11/2021   Lab Results  Component Value Date   CHOLHDL 3.9 02/11/2021   No results found for: HGBA1C     Assessment & Plan:   Problem List Items Addressed This Visit       Other   Encounter for vasectomy counseling - Primary    Referral to neurology is completed.  Education provided to patient on procedure.      Relevant Orders   Ambulatory referral to Urology     No orders of the defined types were placed in this encounter.     M , NP  

## 2021-08-12 NOTE — Patient Instructions (Addendum)
Vasectomy Vasectomy is a procedure in which the vas deferens is cut and then tied or burned (cauterized). The vas deferens is a tube that carries sperm from the testicle to the part of the body that drains urine from the bladder (urethra). This procedure blocks sperm from going through the vas deferens and penis during ejaculation. This ensures that sperm does not go into the vagina during sex. Vasectomy does not affect sexual desire or performance and does notprevent sexually transmitted infections. Vasectomy is considered a permanent and very effective form of birth control (contraception). The decision to have a vasectomy should not be made during a stressful time, such as after the loss of a pregnancy or a divorce. You and your partner should decide on whether to have a vasectomy when you are sure that you do not wantchildren in the future. Tell a health care provider about: Any allergies you have. All medicines you are taking, including vitamins, herbs, eye drops, creams, and over-the-counter medicines. Any problems you or family members have had with anesthetic medicines. Any blood disorders you have. Any surgeries you have had. Any medical conditions you have. What are the risks? Generally, this is a safe procedure. However, problems may occur, including: Infection. Bleeding and swelling of the scrotum. The scrotum is the sac that contains the testicles, blood vessels, and structures that help deliver sperm and semen. Allergic reactions to medicines. Failure of the procedure to prevent pregnancy. There is a very small chance that the tied or cauterized ends of the vas deferens may reconnect (recanalization). If this happens, you could still make a woman pregnant. Pain in the scrotum that continues after you heal from the procedure. What happens before the procedure? Medicines Ask your health care provider about: Changing or stopping your regular medicines. This is especially important if  you are taking diabetes medicines or blood thinners. Taking medicines such as aspirin and ibuprofen. These medicines can thin your blood. Do not take these medicines unless your health care provider tells you to take them. Taking over-the-counter medicines, vitamins, herbs, and supplements. You may be told to take a medicine to help you relax (sedative) a few hours before the procedure. General instructions Do not use any products that contain nicotine or tobacco for at least 4 weeks before the procedure. These products include cigarettes, e-cigarettes, and chewing tobacco. If you need help quitting, ask your health care provider. Plan to have a responsible adult take you home from the hospital or clinic. If you will be going home right after the procedure, plan to have a responsible adult care for you for the time you are told. This is important. Ask your health care provider: How your surgery site will be marked. What steps will be taken to help prevent infection. These steps may include: Removing hair at the surgery site. Washing skin with a germ-killing soap. Taking antibiotic medicine. What happens during the procedure?  You will be given one or more of the following: A sedative, unless you were told to take this a few hours before the procedure. A medicine to numb the area (local anesthetic). Your health care provider will feel, or palpate, for your vas deferens. To reach the vas deferens, one of two methods may be used: A very small incision may be made in your scrotum. A punctured opening may be made in your scrotum, without an incision. Your vas deferens will be pulled out of your scrotum and cut. Then, the vas deferens will be closed in one   of two ways: Tied at the ends. Cauterized at the ends to seal them off. The vas deferens will be put back into your scrotum. The incision or puncture opening will be closed with absorbable stitches (sutures). The sutures will eventually  dissolve and will not need to be removed after the procedure. The procedure will be repeated on the other side of your scrotum. The procedure may vary among health care providers and hospitals. What happens after the procedure? You will be monitored to make sure that you do not have problems. You will be asked not to ejaculate for at least 1 week after the procedure, or for as long as you are told. You will need to use a different form of contraception for 2-4 months after the procedure, until you have test results confirming that there are no sperm in your semen. You may be given scrotal support to wear, such as a jockstrap or underwear with a supportive pouch. If you were given a sedative during the procedure, it can affect you for several hours. Do not drive or operate machinery until your health care provider says that it is safe. Summary Vasectomy blocks sperm from being released during ejaculation. This procedure is considered a permanent and very effective form of birth control. Your scrotum will be numbed with medicine (local anesthetic) for the procedure. After the procedure, you will be asked not to ejaculate for at least 1 week, or for as long as you are told. You will also need to use a different form of contraception until your test results confirm that there are no sperm in your semen. This information is not intended to replace advice given to you by your health care provider. Make sure you discuss any questions you have with your healthcare provider. Document Revised: 02/14/2020 Document Reviewed: 02/14/2020 Elsevier Patient Education  2022 [employer]  

## 2021-08-24 IMAGING — DX DG KNEE 1-2V*L*
2 series · 2 of 2 positions shown · non-contrast
Comparison: None.

CLINICAL DATA: Severe left knee pain

EXAM:
LEFT KNEE - 1-2 VIEW

[knee ap]
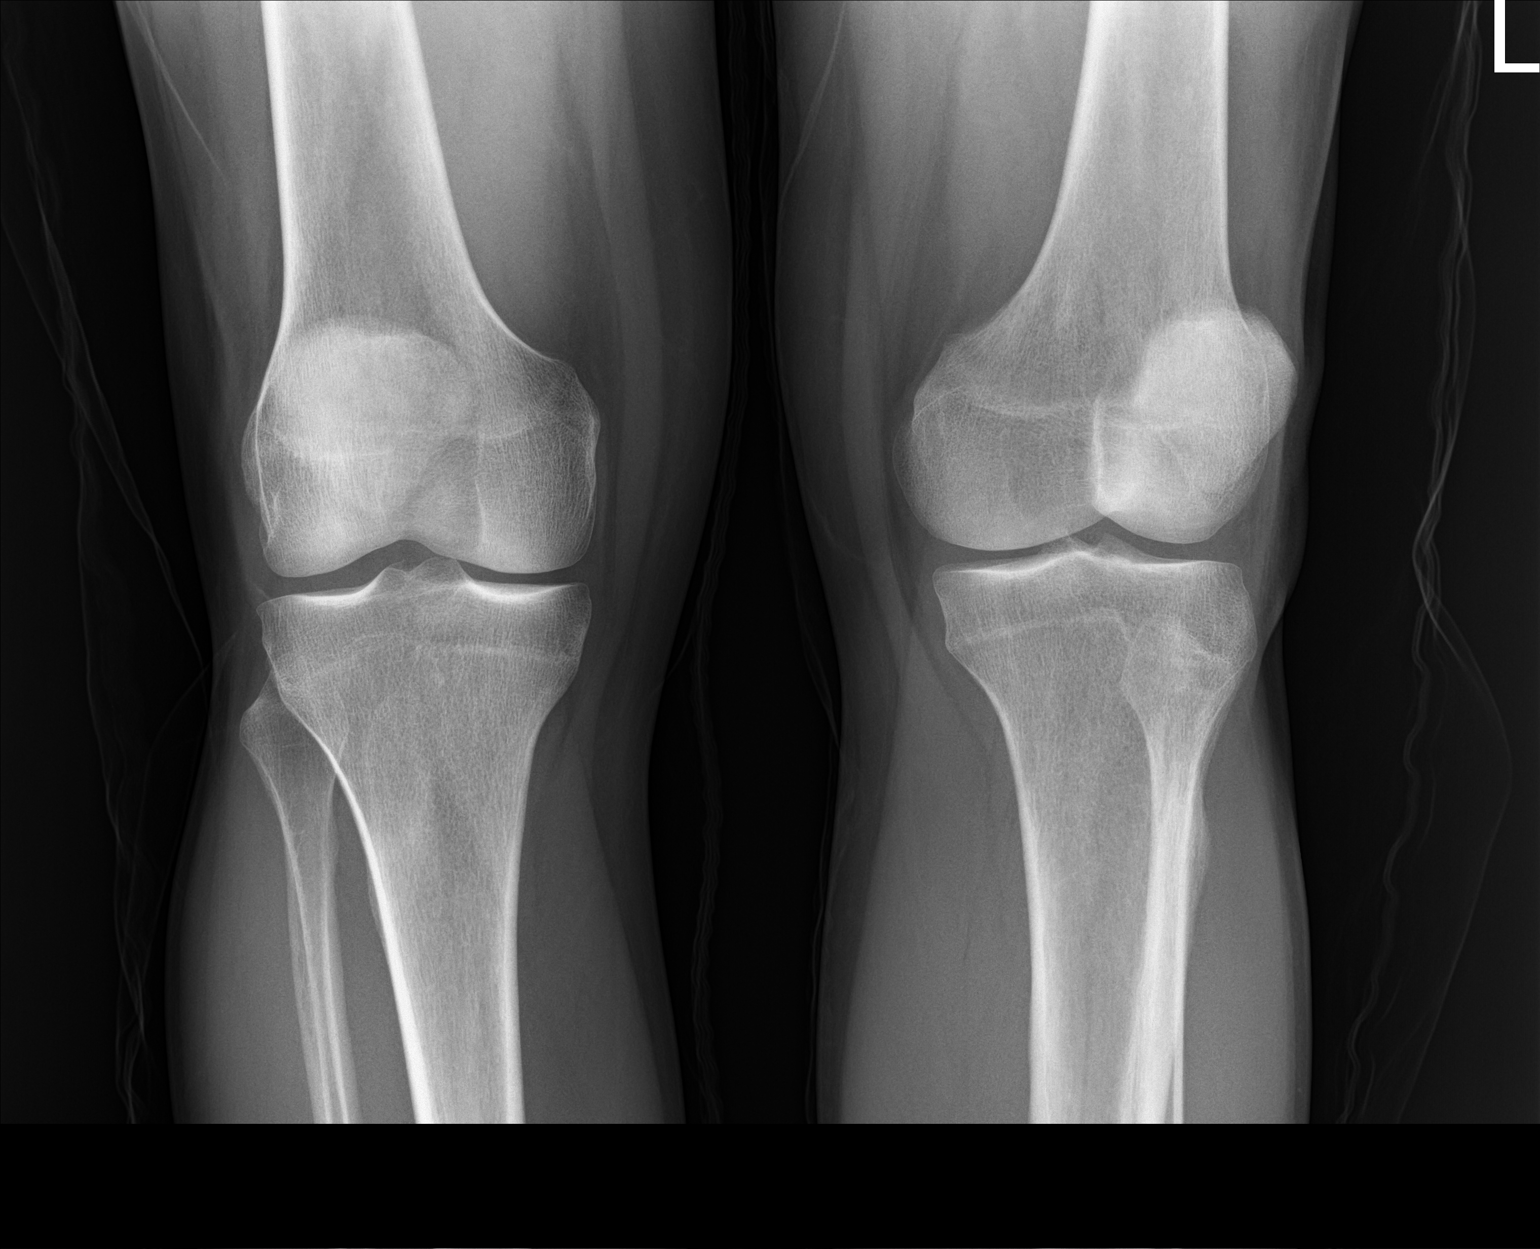

[knee lat]
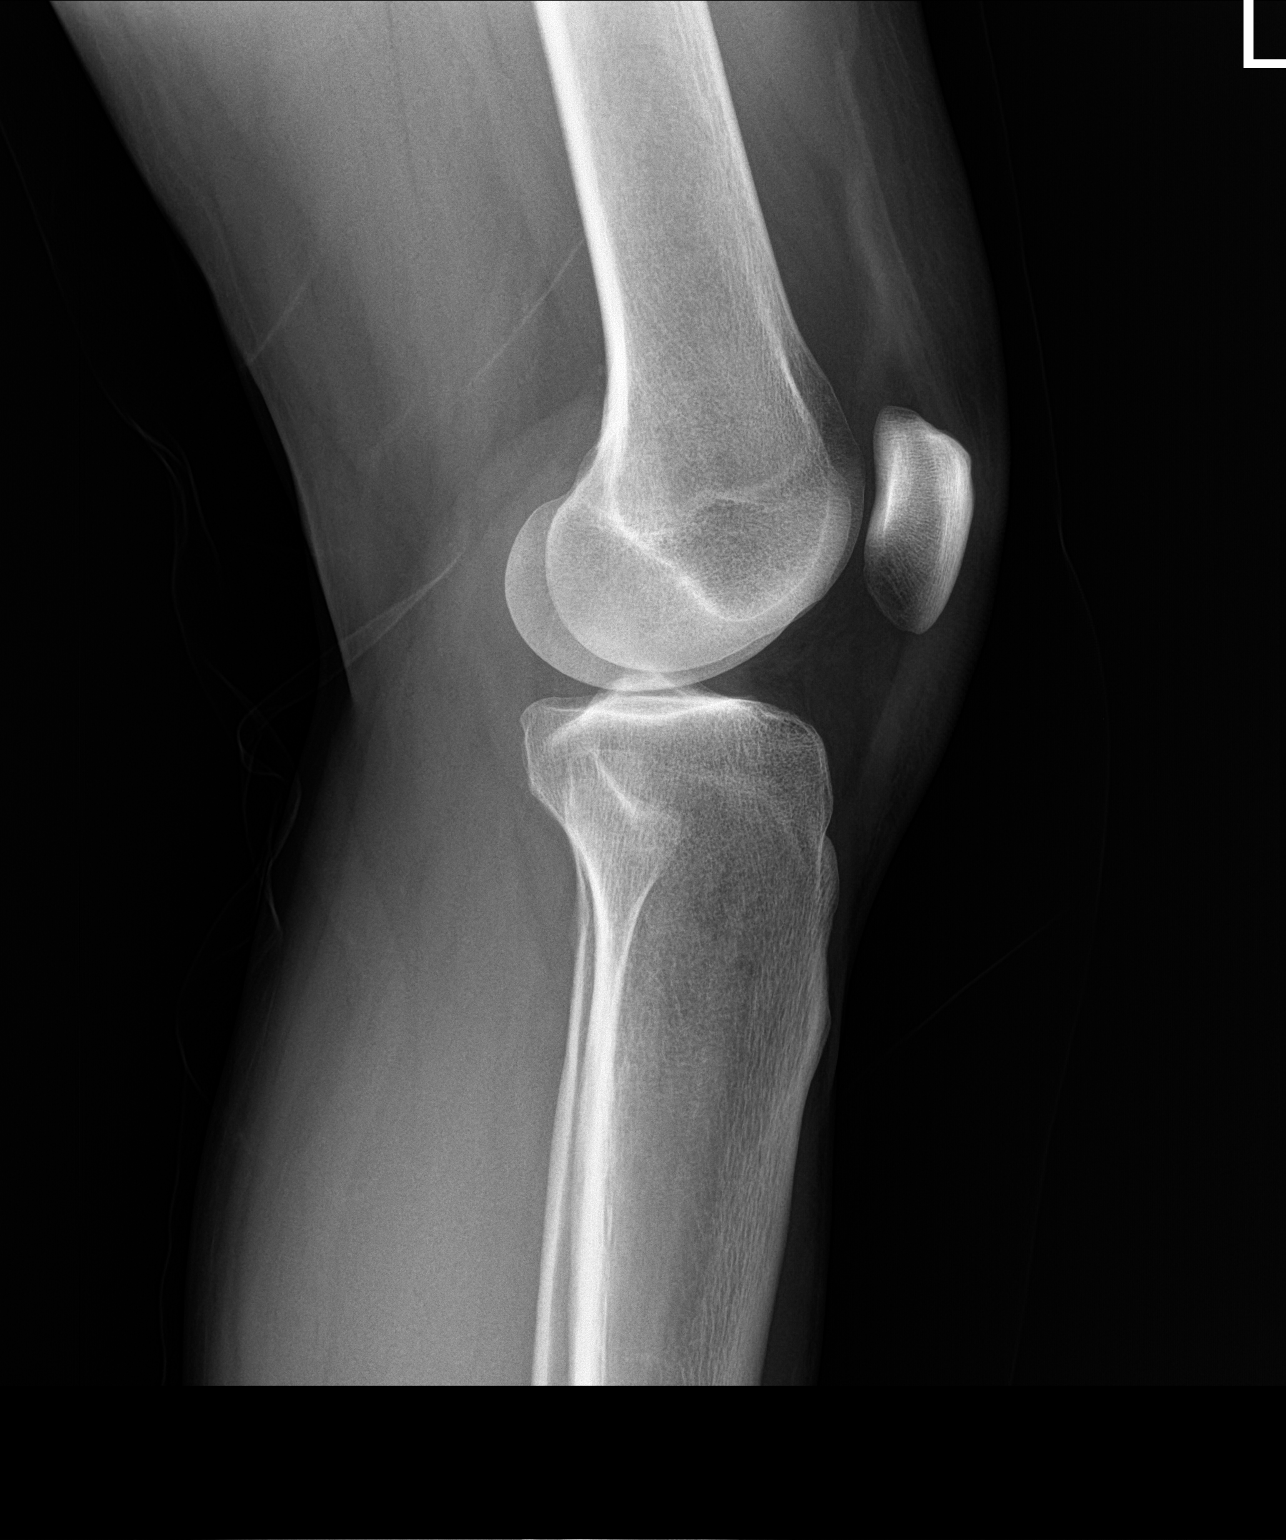

[2 of 2 positions shown; findings below may reference images not displayed]

FINDINGS: No evidence of fracture, dislocation, or joint effusion. No evidence
of arthropathy or other focal bone abnormality. Soft tissues are
unremarkable.
IMPRESSION: Negative.

## 2021-09-29 ENCOUNTER — Ambulatory Visit: Payer: PRIVATE HEALTH INSURANCE | Admitting: Urology

## 2021-10-14 ENCOUNTER — Encounter: Payer: Self-pay | Admitting: Nurse Practitioner

## 2021-10-14 ENCOUNTER — Ambulatory Visit: Payer: BC Managed Care – PPO | Admitting: Nurse Practitioner

## 2021-10-14 VITALS — BP 121/76 | HR 83 | Temp 97.5°F | Ht 70.0 in | Wt 209.2 lb

## 2021-10-14 DIAGNOSIS — H9202 Otalgia, left ear: Secondary | ICD-10-CM

## 2021-10-14 DIAGNOSIS — H9209 Otalgia, unspecified ear: Secondary | ICD-10-CM | POA: Insufficient documentation

## 2021-10-14 MED ORDER — PREDNISONE 10 MG (21) PO TBPK: ORAL_TABLET | ORAL | 0 refills | Status: DC

## 2021-10-14 MED ORDER — IBUPROFEN 600 MG PO TABS: 600.0000 mg | ORAL_TABLET | Freq: Three times a day (TID) | ORAL | 0 refills | Status: DC | PRN

## 2021-10-14 MED ORDER — DEBROX 6.5 % OT SOLN: 5.0000 [drp] | Freq: Two times a day (BID) | OTIC | 0 refills | Status: DC

## 2021-10-14 NOTE — Progress Notes (Signed)
Acute Office Visit  Subjective:    Patient ID: Jake Walsh, male    DOB: March 16, 1986, 36 y.o.   MRN: [id number]  Chief Complaint  Patient presents with   Otalgia    Otalgia  There is pain in the left ear. This is a new problem. The current episode started 1 to 4 weeks ago. Progression since onset: intermittently. There has been no fever. The pain is moderate. Associated symptoms include headaches and hearing loss. Pertinent negatives include no coughing, ear discharge or sore throat. He has tried ear drops for the symptoms. The treatment provided no relief.    Past Medical History:  Diagnosis Date   Anxiety    Coarse tremors     No past surgical history on file.  Family History  Problem Relation Age of Onset   Arthritis/Rheumatoid Mother    Gout Mother    Arthritis Mother    Parkinson's disease Father    Diabetes Father    Bipolar disorder Father    Gallstones Sister    Hodgkin's lymphoma Sister    ADD / ADHD Son    ODD Son     Social History   Socioeconomic History   Marital status: Married    Spouse name: Tillie Rung   Number of children: 3   Years of education: Not on file   Highest education level: Some college, no degree  Occupational History   Not on file  Tobacco Use   Smoking status: Every Day    Packs/day: 0.25    Types: Cigarettes   Smokeless tobacco: Never  Vaping Use   Vaping Use: Every day   Substances: Nicotine  Substance and Sexual Activity   Alcohol use: No    Comment: occ   Drug use: No   Sexual activity: Yes    Birth control/protection: None  Other Topics Concern   Not on file  Social History Narrative   Not on file   Social Determinants of Health   Financial Resource Strain: Not on file  Food Insecurity: Not on file  Transportation Needs: Not on file  Physical Activity: Not on file  Stress: Not on file  Social Connections: Not on file  Intimate Partner Violence: Not on file    Outpatient Medications Prior to Visit   Medication Sig Dispense Refill   omeprazole (PRILOSEC) 20 MG capsule Take 1 capsule (20 mg total) by mouth daily. 30 capsule 3   naproxen sodium (ALEVE) 220 MG tablet Take 220 mg by mouth.     No facility-administered medications prior to visit.    No Known Allergies  Review of Systems  Constitutional: Negative.   HENT:  Positive for ear pain and hearing loss. Negative for ear discharge and sore throat.   Respiratory:  Negative for cough.   Gastrointestinal: Negative.   Neurological:  Positive for headaches.  All other systems reviewed and are negative.     Objective:    Physical Exam Vitals and nursing note reviewed.  Constitutional:      Appearance: Normal appearance.  HENT:     Right Ear: External ear normal. No drainage. There is impacted cerumen.     Left Ear: External ear normal. Tenderness present. No drainage. There is impacted cerumen.     Nose: Nose normal.     Mouth/Throat:     Mouth: Mucous membranes are moist.     Pharynx: Oropharynx is clear.  Cardiovascular:     Rate and Rhythm: Normal rate and regular rhythm.  Pulses: Normal pulses.     Heart sounds: Normal heart sounds.  Pulmonary:     Effort: Pulmonary effort is normal.     Breath sounds: Normal breath sounds.  Abdominal:     General: Bowel sounds are normal.  Skin:    General: Skin is warm.     Findings: No rash.  Neurological:     Mental Status: He is alert and oriented to person, place, and time.  Psychiatric:        Behavior: Behavior normal.    BP 121/76    Pulse 83    Temp (!) 97.5 F (36.4 C) (Temporal)    Ht 5' 10" (1.778 m)    Wt 209 lb 4 oz (94.9 kg)    BMI 30.02 kg/m  Wt Readings from Last 3 Encounters:  10/14/21 209 lb 4 oz (94.9 kg)  08/12/21 206 lb 3.2 oz (93.5 kg)  05/22/21 207 lb 8 oz (94.1 kg)    Health Maintenance Due  Topic Date Due   Pneumococcal Vaccine 43-12 Years old (1 - PCV) Never done   COVID-19 Vaccine (2 - Pfizer series) 11/21/2019   INFLUENZA VACCINE   Never done    There are no preventive care reminders to display for this patient.   No results found for: TSH Lab Results  Component Value Date   WBC 8.3 02/11/2021   HGB 14.9 02/11/2021   HCT 44.5 02/11/2021   MCV 85 02/11/2021   PLT 214 02/11/2021   Lab Results  Component Value Date   NA 141 02/11/2021   K 5.2 02/11/2021   CO2 25 02/11/2021   GLUCOSE 98 02/11/2021   BUN 11 02/11/2021   CREATININE 0.83 02/11/2021   BILITOT 0.5 02/11/2021   ALKPHOS 73 02/11/2021   AST 17 02/11/2021   ALT 19 02/11/2021   PROT 6.8 02/11/2021   ALBUMIN 4.7 02/11/2021   CALCIUM 9.7 02/11/2021   EGFR 119 02/11/2021   Lab Results  Component Value Date   CHOL 193 02/11/2021   Lab Results  Component Value Date   HDL 50 02/11/2021   Lab Results  Component Value Date   LDLCALC 111 (H) 02/11/2021   Lab Results  Component Value Date   TRIG 181 (H) 02/11/2021   Lab Results  Component Value Date   CHOLHDL 3.9 02/11/2021   No results found for: HGBA1C     Assessment & Plan:    Ear pain not well controlled in the past 1-2 weeks, patient wears ear plugs for work all day.   Red , irritated ear canal with tenderness. Ear was build up _cerumen impaction completed ibuprofen for pain prednisone taper -debrox over the counter for earwax build up in the future _follow up with worsening unresolved symptoms.   Problem List Items Addressed This Visit   None Visit Diagnoses     Ear pain, left    -  Primary   Relevant Medications   carbamide peroxide (DEBROX) 6.5 % OTIC solution   ibuprofen (ADVIL) 600 MG tablet   predniSONE (STERAPRED UNI-PAK 21 TAB) 10 MG (21) TBPK tablet        Meds ordered this encounter  Medications   carbamide peroxide (DEBROX) 6.5 % OTIC solution    Sig: Place 5 drops into both ears 2 (two) times daily.    Dispense:  15 mL    Refill:  0    Order Specific Question:   Supervising Provider    Answer:   Claretta Fraise [phone]  ibuprofen (ADVIL) 600 MG  tablet    Sig: Take 1 tablet (600 mg total) by mouth every 8 (eight) hours as needed.    Dispense:  30 tablet    Refill:  0    Order Specific Question:   Supervising Provider    Answer:   Jeneen Rinks   predniSONE (STERAPRED UNI-PAK 21 TAB) 10 MG (21) TBPK tablet    Sig: 6 tablets day 1, 5 tablets day 2, 4 tablets day 3, 3 tablets day 4, 2 tablet day 5, 1 tablet day 6    Dispense:  1 each    Refill:  0    Order Specific Question:   Supervising Provider    AnswerJeneen Rinks     Ivy Lynn, NP

## 2021-10-14 NOTE — Patient Instructions (Signed)
Ear Irrigation Ear irrigation is a procedure to wash dirt and wax out of your ear canal. This procedure is also called lavage. You may need ear irrigation if you are having trouble hearing because of a buildup of earwax. You may also have ear irrigation as part of the treatment for an ear infection. Getting wax and dirtout of your ear canal can help ear drops work better. Tell a health care provider about: Any allergies you have. All medicines you are taking, including vitamins, herbs, eye drops, creams, and over-the-counter medicines. Any problems you or family members have had with anesthetic medicines. Any blood disorders you have. Any surgeries you have had. This includes any ear surgeries. Any medical conditions you have. Whether you are pregnant or may be pregnant. What are the risks? Generally, this is a safe procedure. However, problems may occur, including: Infection. Pain. Hearing loss. Fluid and debris being pushed through the eardrum and into the middle ear. This can occur if there are holes in the eardrum. Ear irrigation failing to work. What happens before the procedure? You will talk with your provider about the procedure and plan. You may be given ear drops to put in your ear 15-20 minutes before irrigation. This helps loosen the wax. What happens during the procedure?  A syringe is filled with water or saline solution, which is made of salt and water. The syringe is gently inserted into the ear canal. The fluid is used to flush out wax and other debris. The procedure may vary among health care providers and hospitals. What can I expect after the procedure? After an ear irrigation, follow instructions given to you by your health careprovider. Follow these instructions at home: Using ear irrigation kits Ear irrigation kits are available for use at home. Ask your health care provider if this is an option for you. In general, you should: Use a home irrigation kit only as  told by your health care provider. Read the package instructions carefully. Follow the directions for using the syringe. Use water that is room temperature. Do not do ear irrigation at home if you: Have diabetes. Diabetes increases the risk of infection. Have a hole or tear in your eardrum. Have tubes in your ears. Have had any ear surgery in the past. Have been told not to irrigate your ears. Cleaning your ears  Clean the outside of your ear with a soft washcloth daily. If told by your health care provider, use a few drops of baby oil, mineral oil, glycerin, hydrogen peroxide, or over-the-counter earwax softening drops. Do not use cotton swabs to clean your ears. These can push wax down into the ear canal. Do not put anything into your ears to try to remove wax. This includes ear candles.  General instructions Take over-the-counter and prescription medicines only as told by your health care provider. If you were prescribed an antibiotic medicine, use it as told by your health care provider. Do not stop using the antibiotic even if your condition improves. Keep the ear clean and dry by following the instructions from your health care provider. Keep all follow-up visits. This is important. Visit your health care provider at least once a year to have your ears and hearing checked. Contact a health care provider if: Your hearing is not improving or is getting worse. You have pain or redness in your ear. You are dizzy. You have ringing in your ears. You have nausea or vomiting. You have fluid, blood, or pus coming out of   of your ear. Summary Ear irrigation is a procedure to wash dirt and wax out of your ear canal. This procedure is also called lavage. To perform ear irrigation, ear drops may be put in your ear 15-20 minutes before irrigation. Water or saline solution will be used to flush out earwax and other debris. You may be able to irrigate your ears at home. Ask your health care provider  if this is an option for you. Follow your health care provider's instructions. Clean your ears with a soft cloth after irrigation. Do not use cotton swabs to clean your ears. These can push wax down into the ear canal. This information is not intended to replace advice given to you by your health care provider. Make sure you discuss any questions you have with your health care provider. Document Revised: 01/15/2020 Document Reviewed: 01/15/2020 Elsevier Patient Education  [place], Adult An earache, or ear pain, can be caused by many things, including: An infection. Ear wax buildup. Ear pressure. Something in the ear that should not be there (foreign body). A sore throat. Tooth problems. Jaw problems. Treatment of the earache will depend on the cause. If the cause is not clear or cannot be determined, you may need to watch your symptoms until your earache goes away or until a cause is found. Follow these instructions at home: Medicines Take or apply over-the-counter and prescription medicines only as told by your health care provider. If you were prescribed an antibiotic medicine, use it as told by your health care provider. Do not stop using the antibiotic even if you start to feel better. Do not put anything in your ear other than medicine that is prescribed by your health care provider. Managing pain If directed, apply heat to the affected area as often as told by your health care provider. Use the heat source that your health care provider recommends, such as a moist heat pack or a heating pad. Place a towel between your skin and the heat source. Leave the heat on for 20-30 minutes. Remove the heat if your skin turns bright red. This is especially important if you are unable to feel pain, heat, or cold. You may have a greater risk of getting burned. If directed, put ice on the affected area as often as told by your health care provider. To do this:   Put ice in a  plastic bag. Place a towel between your skin and the bag. Leave the ice on for 20 minutes, 2-3 times a day. General instructions Pay attention to any changes in your symptoms. Try resting in an upright position instead of lying down. This may help to reduce pressure in your ear and relieve pain. Chew gum if it helps to relieve your ear pain. Treat any allergies as told by your health care provider. Drink enough fluid to keep your urine pale yellow. It is up to you to get the results of any tests that were done. Ask your health care provider, or the department that is doing the tests, when your results will be ready. Keep all follow-up visits as told by your health care provider. This is important. Contact a health care provider if: Your pain does not improve within 2 days. Your earache gets worse. You have new symptoms. You have a fever. Get help right away if you: Have a severe headache. Have a stiff neck. Have trouble swallowing. Have redness or swelling behind your ear. Have fluid or blood coming from your  ear. Have hearing loss. Feel dizzy. Summary An earache, or ear pain, can be caused by many things. Treatment of the earache will depend on the cause. Follow recommendations from your health care provider to treat your ear pain. If the cause is not clear or cannot be determined, you may need to watch your symptoms until your earache goes away or until a cause is found. Keep all follow-up visits as told by your health care provider. This is important. This information is not intended to replace advice given to you by your health care provider. Make sure you discuss any questions you have with your health care provider. Document Revised: 05/05/2019 Document Reviewed: 05/05/2019 Elsevier Patient Education  [place].

## 2022-03-17 ENCOUNTER — Ambulatory Visit: Payer: BC Managed Care – PPO | Admitting: Nurse Practitioner

## 2022-03-17 ENCOUNTER — Encounter: Payer: Self-pay | Admitting: Nurse Practitioner

## 2022-03-17 VITALS — BP 133/81 | HR 70 | Ht 70.0 in | Wt 204.0 lb

## 2022-03-17 DIAGNOSIS — M79652 Pain in left thigh: Secondary | ICD-10-CM

## 2022-03-17 MED ORDER — CYCLOBENZAPRINE HCL 5 MG PO TABS: 5.0000 mg | ORAL_TABLET | Freq: Three times a day (TID) | ORAL | 1 refills | Status: DC | PRN

## 2022-03-17 NOTE — Patient Instructions (Signed)
Leg Cramps Leg cramps occur when one or more muscles tighten and a person has no control over it (involuntary muscle contraction). Muscle cramps are most common in the calf muscles of the leg. They can occur during exercise or at rest. Leg cramps are painful, and they may last for a few seconds to a few minutes. Cramps may return several times before they finally stop. Usually, leg cramps are not caused by a serious medical problem. In many cases, the cause is not known. Some common causes include: Excessive physical effort (overexertion), such as during intense exercise. Doing the same motion over and over. Staying in a certain position for a long period of time. Improper preparation, form, or technique while doing a sport or an activity. Dehydration. Injury. Side effects of certain medicines. Abnormally low levels of minerals in your blood (electrolytes), especially potassium and calcium. This could result from: Pregnancy. Taking diuretic medicines. Follow these instructions at home: Eating and drinking Drink enough fluid to keep your urine pale yellow. Staying hydrated may help prevent cramps. Eat a healthy diet that includes plenty of nutrients to help your muscles function. A healthy diet includes fruits and vegetables, lean protein, whole grains, and low-fat or nonfat dairy products. Managing pain, stiffness, and swelling     Try massaging, stretching, and relaxing the affected muscle. Do this for several minutes at a time. If directed, put ice on areas that are sore or painful after a cramp. To do this: Put ice in a plastic bag. Place a towel between your skin and the bag. Leave the ice on for 20 minutes, 2-3 times a day. Remove the ice if your skin turns bright red. This is very important. If you cannot feel pain, heat, or cold, you have a greater risk of damage to the area. If directed, apply heat to muscles that are tense or tight. Do this before you exercise, or as often as told  by your health care provider. Use the heat source that your health care provider recommends, such as a moist heat pack or a heating pad. To do this: Place a towel between your skin and the heat source. Leave the heat on for 20-30 minutes. Remove the heat if your skin turns bright red. This is especially important if you are unable to feel pain, heat, or cold. You may have a greater risk of getting burned. Try taking hot showers or baths to help relax tight muscles. General instructions If you are having frequent leg cramps, avoid intense exercise for several days. Take over-the-counter and prescription medicines only as told by your health care provider. Keep all follow-up visits. This is important. Contact a health care provider if: Your leg cramps get more severe or more frequent, or they do not improve over time. Your foot becomes cold, numb, or blue. Summary Muscle cramps can develop in any muscle, but the most common place is in the calf muscles of the leg. Leg cramps are painful, and they may last for a few seconds to a few minutes. Usually, leg cramps are not caused by a serious medical problem. Often, the cause is not known. Stay hydrated, and take over-the-counter and prescription medicines only as told by your health care provider. This information is not intended to replace advice given to you by your health care provider. Make sure you discuss any questions you have with your health care provider. Document Revised: 02/13/2020 Document Reviewed: 02/13/2020 Elsevier Patient Education  2023 [employer] Hamstring Strain  A  hamstring strain happens when the muscles in the back of the thighs (hamstring muscles) are overstretched or torn. The hamstring muscles are used in straightening the hips, bending the knees, and pulling back the legs. This injury is often called a pulled hamstring muscle. The tissue that connects the muscle to a bone (tendon) may also be affected. The severity of  a hamstring strain may be rated in degrees or grades. First-degree (or grade 1) strains have the least amount of muscle tearing and pain. Second-degree and third-degree (grade 2 and 3) strains have increasingly more tearing and pain. What are the causes? This condition is caused by a sudden, violent force being placed on the hamstring muscles, stretching them too far. This often happens during activities that involve sprinting, jumping, kicking, or weight lifting. What increases the risk? Hamstring strains are especially common in athletes. The following factors may also make you more likely to develop this condition: Having low strength, endurance, or flexibility of the hamstring muscles. Doing high-impact physical activity or sports. Having poor physical fitness. Having a previous leg injury. Having tired (fatigued) muscles. Having a previous hamstring strain. What are the signs or symptoms? Symptoms of this condition include: Pain in the back of the thigh or buttocks. Swelling. Bruising. Muscle spasms. Trouble moving the affected muscle because of pain. For severe strains, you may feel popping or snapping in the back of your thigh when the injury occurs. How is this diagnosed? This condition is diagnosed based on your symptoms, your medical history, and a physical exam. You may also have imaging tests, such as an MRI or X-rays. Your strain may be rated based on how severe it is. The ratings are: Grade 1 strain (mild). Muscles are overstretched. There may be very small muscle tears. Grade 2 strain (moderate). Muscles are partially torn. Grade 3 strain (severe). Muscles are completely torn. How is this treated? Treatment for this condition usually involves: Protecting, resting, icing, applying compression, and elevating the injured area (PRICE therapy). Medicines. Your health care provider may recommend medicines to help reduce pain or inflammation. Doing exercises to regain strength  and flexibility in the muscles. Your health care provider will tell you when it is okay to begin exercising. Hamstring strains may take a long time to heal. This type of strain may happen again in athletes. Follow your health care provider's advice about when to return to sports-related activities. Follow these instructions at home: PRICE therapy Use PRICE therapy to promote muscle healing during the first 2-3 days after your injury, or as told by your health care provider. Protect the muscle from being injured again. Rest your injury. This usually involves limiting your normal activities and not using the injured hamstring muscle. Talk with your health care provider about how you should limit your activities. Apply ice to the injured area: Put ice in a plastic bag. Place a towel between your skin and the bag. Leave the ice on for 20 minutes, 2-3 times a day. After the third day, switch to applying heat as told. Put pressure (compression) on your injured hamstring by wrapping it with an elastic bandage. Be careful not to wrap it too tightly. That may interfere with blood circulation or may increase swelling. Raise (elevate) your injured hamstring above the level of your heart as often as possible. When you are lying down, you can do this by putting a pillow under your thigh.  Activity Begin exercising or stretching only as told by your health care provider. Do not  return to full activity level until your health care provider approves. To help prevent muscle strains in the future, always warm up before exercising and stretch afterward. General instructions Take over-the-counter and prescription medicines only as told by your health care provider. If directed, apply heat to the affected area as often as told by your health care provider. Use the heat source that your health care provider recommends, such as a moist heat pack or a heating pad. Place a towel between your skin and the heat  source. Leave the heat on for 20-30 minutes. Remove the heat if your skin turns bright red. This is especially important if you are unable to feel pain, heat, or cold. You may have a greater risk of getting burned. Keep all follow-up visits. This is important. Contact a health care provider if: You have increasing pain or swelling in the injured area. You have numbness, tingling, or a significant loss of strength in the injured area. Get help right away if: Your foot or your toes become cold or turn blue. Summary A hamstring strain happens when the muscles in the back of the thighs (hamstring muscles) are overstretched or torn. This injury can be caused by a sudden, violent force being placed on the hamstring muscles, causing them to stretch too far. Symptoms include pain, swelling, and muscle spasms in the injured area. Treatment includes PRICE therapy: protecting, resting, icing, applying compression, and elevating the injured area. This information is not intended to replace advice given to you by your health care provider. Make sure you discuss any questions you have with your health care provider. Document Revised: 02/26/2021 Document Reviewed: 02/26/2021 Elsevier Patient Education  2023 [employer]

## 2022-03-17 NOTE — Progress Notes (Signed)
Acute Office Visit  Subjective:     Patient ID: Jake Walsh, male    DOB: 08-31-1986, 36 y.o.   MRN: [id number]  Chief Complaint  Patient presents with   Leg Pain    Left thigh for about 1 day now , sharp pain 7 out of 10     Leg Pain  The incident occurred 5 to 7 days ago. The incident occurred at home. There was no injury mechanism. The pain is present in the left thigh. The quality of the pain is described as aching. The pain is at a severity of 8/10. The pain is severe. The pain has been Constant since onset. He reports no foreign bodies present. The symptoms are aggravated by movement and palpation. He has tried nothing for the symptoms. The treatment provided no relief.    Review of Systems  Constitutional: Negative.   HENT: Negative.    Eyes: Negative.   Respiratory: Negative.    Cardiovascular: Negative.   Genitourinary: Negative.   Musculoskeletal:  Positive for myalgias.  Skin: Negative.  Negative for rash.  All other systems reviewed and are negative.      Objective:    BP 133/81   Pulse 70   Ht 5\' 10"  (1.778 m)   Wt 204 lb (92.5 kg)   SpO2 93%   BMI 29.27 kg/m    Physical Exam Vitals and nursing note reviewed.  Constitutional:      Appearance: Normal appearance.  HENT:     Head: Normocephalic.     Right Ear: External ear normal.     Left Ear: External ear normal.     Nose: Nose normal.  Eyes:     Conjunctiva/sclera: Conjunctivae normal.  Cardiovascular:     Rate and Rhythm: Normal rate and regular rhythm.     Pulses: Normal pulses.     Heart sounds: Normal heart sounds.  Pulmonary:     Effort: Pulmonary effort is normal.  Abdominal:     General: Bowel sounds are normal.  Musculoskeletal:     Left upper leg: Tenderness present.       Legs:     Comments: Left eye tenderness present.  Skin:    General: Skin is warm.     Findings: No rash.  Neurological:     Mental Status: He is alert and oriented to person, place, and time.    No  results found for any visits on 03/17/22.      Assessment & Plan:  Patient presents with left eye tenderness.  No injury associated with current symptoms.  Advised patient to take medication as prescribed.  Flexeril 5 mg tablet ordered for muscle spasms.  Continue anti-inflammatory as needed.  Warm compress as tolerated.  Follow-up with unresolved symptoms.  Patient verbalized understanding. Problem List Items Addressed This Visit   None Visit Diagnoses     Pain of left thigh    -  Primary   Relevant Medications   cyclobenzaprine (FLEXERIL) 5 MG tablet       Meds ordered this encounter  Medications   cyclobenzaprine (FLEXERIL) 5 MG tablet    Sig: Take 1 tablet (5 mg total) by mouth 3 (three) times daily as needed for muscle spasms.    Dispense:  30 tablet    Refill:  1    Order Specific Question:   Supervising Provider    Answer:   Claretta Fraise [phone]    Return if symptoms worsen or fail to improve.  Quinn Axe  Christiana Pellant, NP

## 2022-03-25 ENCOUNTER — Encounter: Payer: Self-pay | Admitting: Family Medicine

## 2022-03-25 ENCOUNTER — Ambulatory Visit: Payer: BC Managed Care – PPO | Admitting: Family Medicine

## 2022-03-25 VITALS — BP 135/79 | HR 82 | Temp 98.2°F | Ht 70.0 in | Wt 205.5 lb

## 2022-03-25 DIAGNOSIS — M5432 Sciatica, left side: Secondary | ICD-10-CM | POA: Diagnosis not present

## 2022-03-25 DIAGNOSIS — M79652 Pain in left thigh: Secondary | ICD-10-CM | POA: Diagnosis not present

## 2022-03-25 MED ORDER — PREDNISONE 20 MG PO TABS: 40.0000 mg | ORAL_TABLET | Freq: Every day | ORAL | 0 refills | Status: AC

## 2022-03-25 MED ORDER — DICLOFENAC SODIUM 75 MG PO TBEC: 75.0000 mg | DELAYED_RELEASE_TABLET | Freq: Two times a day (BID) | ORAL | 0 refills | Status: DC

## 2022-03-25 MED ORDER — CYCLOBENZAPRINE HCL 10 MG PO TABS: 10.0000 mg | ORAL_TABLET | Freq: Three times a day (TID) | ORAL | 0 refills | Status: DC | PRN

## 2022-03-25 NOTE — Progress Notes (Signed)
Acute Office Visit  Subjective:     Patient ID: Jake Walsh, male    DOB: [birth year], 36 y.o.   MRN: [id number]  Chief Complaint  Patient presents with   Leg Pain    HPI Patient is in today for follow up of left thigh pain x3 weeks. The pain is along the lateral aspect of his thigh. It is a constant dull ache that is sharp with aggravating factors. Pain is an 8/10. Pain is aggravated by activity, standing, twisting, or bending. Pain is improved with rest. He has had numbness/tingling in his left foot. He recently reports a pulled muscle in his lower back. Reports back pain has resolved. Denies hip pain. Denies changes in bowel or bladder control. 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 He has to walk the loop in the same direction with his left leg on the inside of the loop.   ROS As per HPI.      Objective:    BP (!) 148/96   Pulse 82   Temp 98.2 F (36.8 C) (Temporal)   Ht 5\' 10"  (1.778 m)   Wt 205 lb 8 oz (93.2 kg)   SpO2 99%   BMI 29.49 kg/m    Physical Exam Vitals and nursing note reviewed.  Constitutional:      General: He is not in acute distress.    Appearance: He is not ill-appearing, toxic-appearing or diaphoretic.  Pulmonary:     Effort: Pulmonary effort is normal. No respiratory distress.  Musculoskeletal:     Lumbar back: No edema, signs of trauma, tenderness or bony tenderness. Normal range of motion. Positive left straight leg raise test.     Left hip: Tenderness (lateral muscular) present. No bony tenderness.     Left upper leg: Tenderness (lateral aspect) present. No swelling, edema or bony tenderness.  Skin:    General: Skin is warm and dry.  Neurological:     General: No focal deficit present.     Mental Status: He is alert and oriented to person, place, and time.     Motor: No weakness.     Gait: Gait abnormal (antalgic gait).  Psychiatric:        Mood and Affect: Mood normal.         Behavior: Behavior normal.     No results found for any visits on 03/25/22.      Assessment & Plan:   Jeremey was seen today for leg pain.  Diagnoses and all orders for this visit:  Left sided sciatica Left thigh pain Discussed thigh pain is consistent with sciatica. Prednisone burst ordered. Declined steroid IM injeciton today. Voltaren ordered, do not take other NSAIDs with voltaren. Increase flexeril to 10 mg. Discussed stretching and home exercises along with rest, ice, heat. Referral to PT placed. Work not provided for a few days off. He reports that light duty is not an option at his job and he does not have any PTO currently. Discussed to contact HR regarding STD if symptoms do not improve and he needs additional time off work.  -     predniSONE (DELTASONE) 20 MG tablet; Take 2 tablets (40 mg total) by mouth daily with breakfast for 5 days. -     diclofenac (VOLTAREN) 75 MG EC tablet; Take 1 tablet (75 mg total) by mouth 2 (two) times daily. -     cyclobenzaprine (FLEXERIL) 10 MG tablet;  Take 1 tablet (10 mg total) by mouth 3 (three) times daily as needed for muscle spasms. -     Ambulatory referral to Physical Therapy   Return in about 2 weeks (around 04/08/2022) for leg pain follow up with PCP.  The patient indicates understanding of these issues and agrees with the plan.   Gabriel Earing, FNP

## 2022-03-25 NOTE — Patient Instructions (Signed)
Sciatica  Sciatica is pain, numbness, weakness, or tingling along the path of the sciatic nerve. The sciatic nerve starts in the lower back and runs down the back of each leg. The nerve controls the muscles in the lower leg and in the back of the knee. It also provides feeling (sensation) to the back of the thigh, the lower leg, and the sole of the foot. Sciatica is a symptom of another medical condition that pinches or puts pressure on the sciatic nerve. Sciatica most often only affects one side of the body. Sciatica usually goes away on its own or with treatment. In some cases, sciatica may come back (recur). What are the causes? This condition is caused by pressure on the sciatic nerve or pinching of the nerve. This may be the result of: A disk in between the bones of the spine bulging out too far (herniated disk). Age-related changes in the spinal disks. A pain disorder that affects a muscle in the buttock. Extra bone growth near the sciatic nerve. A break (fracture) of the pelvis. Pregnancy. Tumor. This is rare. What increases the risk? The following factors may make you more likely to develop this condition: Playing sports that place pressure or stress on the spine. Having poor strength and flexibility. A history of back injury or surgery. Sitting for long periods of time. Doing activities that involve repetitive bending or lifting. Obesity. What are the signs or symptoms? Symptoms can vary from mild to very severe, and they may include: Any of these problems in the lower back, leg, hip, or buttock: Mild tingling, numbness, or dull aches. Burning sensations. Sharp pains. Numbness in the back of the calf or the sole of the foot. Leg weakness. Severe back pain that makes movement difficult. Symptoms may get worse when you cough, sneeze, or laugh, or when you sit or stand for long periods of time. How is this diagnosed? This condition may be diagnosed based on: Your symptoms and  medical history. A physical exam. Blood tests. Imaging tests, such as: X-rays. MRI. CT scan. How is this treated? In many cases, this condition improves on its own without treatment. However, treatment may include: Reducing or modifying physical activity. Exercising and stretching. Icing and applying heat to the affected area. Medicines that help to: Relieve pain and swelling. Relax your muscles. Injections of medicines that help to relieve pain, irritation, and inflammation around the sciatic nerve (steroids). Surgery. Follow these instructions at home: Medicines Take over-the-counter and prescription medicines only as told by your health care provider. Ask your health care provider if the medicine prescribed to you: Requires you to avoid driving or using heavy machinery. Can cause constipation. You may need to take these actions to prevent or treat constipation: Drink enough fluid to keep your urine pale yellow. Take over-the-counter or prescription medicines. Eat foods that are high in fiber, such as beans, whole grains, and fresh fruits and vegetables. Limit foods that are high in fat and processed sugars, such as fried or sweet foods. Managing pain     If directed, put ice on the affected area. Put ice in a plastic bag. Place a towel between your skin and the bag. Leave the ice on for 20 minutes, 2-3 times a day. If directed, apply heat to the affected area. Use the heat source that your health care provider recommends, such as a moist heat pack or a heating pad. Place a towel between your skin and the heat source. Leave the heat on for   20-30 minutes. Remove the heat if your skin turns bright red. This is especially important if you are unable to feel pain, heat, or cold. You may have a greater risk of getting burned. Activity  Return to your normal activities as told by your health care provider. Ask your health care provider what activities are safe for you. Avoid  activities that make your symptoms worse. Take brief periods of rest throughout the day. When you rest for longer periods, mix in some mild activity or stretching between periods of rest. This will help to prevent stiffness and pain. Avoid sitting for long periods of time without moving. Get up and move around at least one time each hour. Exercise and stretch regularly, as told by your health care provider. Do not lift anything that is heavier than 10 lb (4.5 kg) while you have symptoms of sciatica. When you do not have symptoms, you should still avoid heavy lifting, especially repetitive heavy lifting. When you lift objects, always use proper lifting technique, which includes: Bending your knees. Keeping the load close to your body. Avoiding twisting. General instructions Maintain a healthy weight. Excess weight puts extra stress on your back. Wear supportive, comfortable shoes. Avoid wearing high heels. Avoid sleeping on a mattress that is too soft or too hard. A mattress that is firm enough to support your back when you sleep may help to reduce your pain. Keep all follow-up visits as told by your health care provider. This is important. Contact a health care provider if: You have pain that: Wakes you up when you are sleeping. Gets worse when you lie down. Is worse than you have experienced in the past. Lasts longer than 4 weeks. You have an unexplained weight loss. Get help right away if: You are not able to control when you urinate or have bowel movements (incontinence). You have: Weakness in your lower back, pelvis, buttocks, or legs that gets worse. Redness or swelling of your back. A burning sensation when you urinate. Summary Sciatica is pain, numbness, weakness, or tingling along the path of the sciatic nerve. This condition is caused by pressure on the sciatic nerve or pinching of the nerve. Sciatica can cause pain, numbness, or tingling in the lower back, legs, hips, and  buttocks. Treatment often includes rest, exercise, medicines, and applying ice or heat. This information is not intended to replace advice given to you by your health care provider. Make sure you discuss any questions you have with your health care provider. Document Revised: 10/16/2018 Document Reviewed: 10/16/2018 Elsevier Patient Education  2023 [employer]  Back Exercises The following exercises strengthen the muscles that help to support the trunk (torso) and back. They also help to keep the lower back flexible. Doing these exercises can help to prevent or lessen existing low back pain. If you have back pain or discomfort, try doing these exercises 2-3 times each day or as told by your health care provider. As your pain improves, do them once each day, but increase the number of times that you repeat the steps for each exercise (do more repetitions). To prevent the recurrence of back pain, continue to do these exercises once each day or as told by your health care provider. Do exercises exactly as told by your health care provider and adjust them as directed. It is normal to feel mild stretching, pulling, tightness, or discomfort as you do these exercises, but you should stop right away if you feel sudden pain or your pain gets  worse. Exercises Single knee to chest Repeat these steps 3-5 times for each leg: Lie on your back on a firm bed or the floor with your legs extended. Bring one knee to your chest. Your other leg should stay extended and in contact with the floor. Hold your knee in place by grabbing your knee or thigh with both hands and hold. Pull on your knee until you feel a gentle stretch in your lower back or buttocks. Hold the stretch for 10-30 seconds. Slowly release and straighten your leg.  Pelvic tilt Repeat these steps 5-10 times: Lie on your back on a firm bed or the floor with your legs extended. Bend your knees so they are pointing toward the ceiling and your feet  are flat on the floor. Tighten your lower abdominal muscles to press your lower back against the floor. This motion will tilt your pelvis so your tailbone points up toward the ceiling instead of pointing to your feet or the floor. With gentle tension and even breathing, hold this position for 5-10 seconds.  Cat-cow Repeat these steps until your lower back becomes more flexible: Get into a hands-and-knees position on a firm bed or the floor. Keep your hands under your shoulders, and keep your knees under your hips. You may place padding under your knees for comfort. Let your head hang down toward your chest. Contract your abdominal muscles and point your tailbone toward the floor so your lower back becomes rounded like the back of a cat. Hold this position for 5 seconds. Slowly lift your head, let your abdominal muscles relax, and point your tailbone up toward the ceiling so your back forms a sagging arch like the back of a cow. Hold this position for 5 seconds.  Press-ups Repeat these steps 5-10 times: Lie on your abdomen (face-down) on a firm bed or the floor. Place your palms near your head, about shoulder-width apart. Keeping your back as relaxed as possible and keeping your hips on the floor, slowly straighten your arms to raise the top half of your body and lift your shoulders. Do not use your back muscles to raise your upper torso. You may adjust the placement of your hands to make yourself more comfortable. Hold this position for 5 seconds while you keep your back relaxed. Slowly return to lying flat on the floor.  Bridges Repeat these steps 10 times: Lie on your back on a firm bed or the floor. Bend your knees so they are pointing toward the ceiling and your feet are flat on the floor. Your arms should be flat at your sides, next to your body. Tighten your buttocks muscles and lift your buttocks off the floor until your waist is at almost the same height as your knees. You should feel  the muscles working in your buttocks and the back of your thighs. If you do not feel these muscles, slide your feet 1-2 inches (2.5-5 cm) farther away from your buttocks. Hold this position for 3-5 seconds. Slowly lower your hips to the starting position, and allow your buttocks muscles to relax completely. If this exercise is too easy, try doing it with your arms crossed over your chest. Abdominal crunches Repeat these steps 5-10 times: Lie on your back on a firm bed or the floor with your legs extended. Bend your knees so they are pointing toward the ceiling and your feet are flat on the floor. Cross your arms over your chest. Tip your chin slightly toward your chest without  bending your neck. Tighten your abdominal muscles and slowly raise your torso high enough to lift your shoulder blades a tiny bit off the floor. Avoid raising your torso higher than that because it can put too much stress on your lower back and does not help to strengthen your abdominal muscles. Slowly return to your starting position.  Back lifts Repeat these steps 5-10 times: Lie on your abdomen (face-down) with your arms at your sides, and rest your forehead on the floor. Tighten the muscles in your legs and your buttocks. Slowly lift your chest off the floor while you keep your hips pressed to the floor. Keep the back of your head in line with the curve in your back. Your eyes should be looking at the floor. Hold this position for 3-5 seconds. Slowly return to your starting position.  Contact a health care provider if: Your back pain or discomfort gets much worse when you do an exercise. Your worsening back pain or discomfort does not lessen within 2 hours after you exercise. If you have any of these problems, stop doing these exercises right away. Do not do them again unless your health care provider says that you can. Get help right away if: You develop sudden, severe back pain. If this happens, stop doing the  exercises right away. Do not do them again unless your health care provider says that you can. This information is not intended to replace advice given to you by your health care provider. Make sure you discuss any questions you have with your health care provider. Document Revised: 03/24/2021 Document Reviewed: 12/10/2020 Elsevier Patient Education  2023 [employer].

## 2022-03-29 ENCOUNTER — Other Ambulatory Visit: Payer: Self-pay | Admitting: Nurse Practitioner

## 2022-03-29 ENCOUNTER — Telehealth: Payer: Self-pay | Admitting: Family Medicine

## 2022-03-29 ENCOUNTER — Encounter: Payer: Self-pay | Admitting: Family Medicine

## 2022-03-29 NOTE — Telephone Encounter (Signed)
Pt called stating that he saw Harlow Mares on 03/25/22 for leg pain and was told to call back if leg pain didn't get any better. Pt says his leg pain hasnt got worse but its not any better either.

## 2022-03-29 NOTE — Telephone Encounter (Signed)
Ok to provide note for another 3 days if pain has not improved. He should contact his HR regarding his missed days.

## 2022-03-29 NOTE — Telephone Encounter (Signed)
Pt aware of provider feedback and voiced understanding. Note written and pt aware in his MyChart.

## 2022-03-31 ENCOUNTER — Ambulatory Visit: Payer: BC Managed Care – PPO | Attending: Family Medicine

## 2022-03-31 DIAGNOSIS — M79652 Pain in left thigh: Secondary | ICD-10-CM | POA: Diagnosis not present

## 2022-03-31 DIAGNOSIS — R262 Difficulty in walking, not elsewhere classified: Secondary | ICD-10-CM | POA: Diagnosis not present

## 2022-03-31 DIAGNOSIS — M5432 Sciatica, left side: Secondary | ICD-10-CM | POA: Diagnosis not present

## 2022-03-31 DIAGNOSIS — M79605 Pain in left leg: Secondary | ICD-10-CM | POA: Insufficient documentation

## 2022-03-31 NOTE — Therapy (Signed)
[facility] [address] [place], [place], [address] Phone: [phone]   Fax:  [phone]  Physical Therapy Evaluation  Patient Details  Name: EUELL SCHIFF MRN: [id number] Date of Birth: 1986/05/31 Referring Provider (PT): Lequita Halt, [place]   Encounter Date: 03/31/2022   PT End of Session - 03/31/22 0945     Visit Number 1    Number of Visits 6    Date for PT Re-Evaluation 06/04/22    PT Start Time 0947    PT Stop Time 1035    PT Time Calculation (min) 48 min    Activity Tolerance Patient tolerated treatment well    Behavior During Therapy [facility] for tasks assessed/performed             Past Medical History:  Diagnosis Date   Anxiety    Coarse tremors     No past surgical history on file.  There were no vitals filed for this visit.    Subjective Assessment - 03/31/22 0948     Subjective Patient reports that he began having left leg pain that began about three weeks ago after having to do prolonged walking at work. He felt that his pain was getting steadily worse, but it has gotten better since he has been out of work for the past week. He notes that his left leg had been buckling at work and it even "gave out" on him causing him to catch himself to prevent a fall. He feels that all of this began when he started working at [employer] about 2 years ago.    Limitations Walking;Standing    How long can you sit comfortably? 1-2 hours prior to having to stop and change position    How long can you stand comfortably? 1-2 hours prior to having to stop and change position    How long can you walk comfortably? 1-2 hours prior to having to stop and change position    Patient Stated Goals reduced pain, stand and walk longer    Currently in Pain? Yes    Pain Score 8     Pain Location Leg    Pain Orientation Left;Posterior    Pain Descriptors / Indicators Tightness    Pain Type Acute pain    Pain Radiating Towards from the left hip to the  left knee, but he also has noticed that his left foot will go numb    Pain Onset 1 to 4 weeks ago    Pain Frequency Constant    Aggravating Factors  standing, walking, sitting    Pain Relieving Factors alternating heat and ice    Effect of Pain on Daily Activities not working currently (plans to return to work on 6/23), but the pain slows him down significantly                [facility] PT Assessment - 03/31/22 0001       Assessment   Medical Diagnosis Left sided sciatica    Referring Provider (PT) Lequita Halt, FNP    Onset Date/Surgical Date --   3 weeks ago   Next MD Visit none scheduled    Prior Therapy No      Precautions   Precautions None      Restrictions   Weight Bearing Restrictions No      Balance Screen   Has the patient fallen in the past 6 months No    Has the patient had a decrease in activity level because of a fear of falling?  No    Is the patient reluctant to leave their home because of a fear of falling?  No      Home Environment   Living Environment Private residence    Home Access Level entry    [place] One level      Prior Function   Level of Independence Independent    Vocation Full time employment    Vocation Requirements 11 hour shift; prolonged walking and standing    Leisure video games      Cognition   Overall Cognitive Status Within Functional Limits for tasks assessed    Attention Focused    Focused Attention Appears intact    Memory Appears intact    Awareness Appears intact    Problem Solving Appears intact      Sensation   Additional Comments Patient reports intermittent left foot numbness, but none currently      ROM / Strength   AROM / PROM / Strength Strength;AROM      AROM   AROM Assessment Site Lumbar    Lumbar Flexion WFL    Lumbar Extension WFL    Lumbar - Right Side Bend 25% limited    Lumbar - Left Side Bend 25% limited   anterior left hip pain   Lumbar - Right Rotation WFL    Lumbar - Left Rotation [facility]       Strength   Strength Assessment Site Hip;Knee;Ankle    Right/Left Hip Right;Left    Right Hip Flexion 5/5    Left Hip Flexion 4+/5   left hip pain and tingling in anterior hip   Right/Left Knee Right;Left    Right Knee Flexion 5/5    Right Knee Extension 5/5    Left Knee Flexion 5/5    Left Knee Extension 5/5   anterior hip pain and tingling   Right/Left Ankle Right;Left    Right Ankle Dorsiflexion 4+/5    Left Ankle Dorsiflexion 4/5      Palpation   Spinal mobility L4-5: mild hypomobility and "pinch" in low back    Palpation comment TTP: left gluteals, piriformis, IT band, TFL, quadriceps, hip flexors, and hamstrng recreate familiar pain      Special Tests    Special Tests Lumbar    Lumbar Tests Straight Leg Raise;Slump Test      Slump test   Findings Positive    Side Left      Straight Leg Raise   Findings Positive    Side  Left    Comment Pulling in hamstring as well      Ambulation/Gait   Ambulation/Gait Yes    Ambulation/Gait Assistance 6: Modified independent (Device/Increase time)    Assistive device None    Gait Pattern Step-through pattern;Decreased stride length;Decreased stance time - left;Decreased weight shift to left;Decreased hip/knee flexion - left;Left foot flat;Right foot flat;Wide base of support    Ambulation Surface Level;Indoor    Gait velocity decreased                        Objective measurements completed on examination: See above findings.       [facility] PT Treatment/Exercise - 03/31/22 0001       Exercises   Exercises Knee/Hip      Knee/Hip Exercises: Supine   Other Supine Knee/Hip Exercises Glute and Hamstring sets   5 second hold; 15 reps   Other Supine Knee/Hip Exercises Sciatic nerve flossing   5 reps  PT Long Term Goals - 03/31/22 1603       PT LONG TERM GOAL #1   Title Patient will be independent with his HEP    Time 6    Period Weeks    Status New    Target Date  05/12/22      PT LONG TERM GOAL #2   Title Patient will be able to return to work without having to stop due to his familiar hip pain.    Time 6    Period Weeks    Status New    Target Date 05/12/22      PT LONG TERM GOAL #3   Title Patient will be able to complete his daily activites without his pain exceeding a 5/10.    Time 6    Period Weeks    Status New    Target Date 05/12/22      PT LONG TERM GOAL #4   Title Patient will be able to ambulate without significant gait deviations for improved functional mobility.    Time 6    Period Weeks    Status New    Target Date 05/12/22                    Plan - 03/31/22 1037     Clinical Impression Statement Patient is a 36 year old male presenting with left lower extremity sciatica secondary to prolonged standing and walking for his critical job demands. He presented with high pain severity and irritability with palpation along his left hip and hamstring reproducing his familiar pain. He also experienced low back pain with lumbar joint mobility testing and left anterior hip pain with manual muscle testing, but he reported that this was not his familiar pain. He was provided a HEP which he was able to properly and safely perform. He reported feeling comfortable with these interventions. Recommend that he continue with skilled physical therapy to address his remaining impairments to return to his prior level of function.    Personal Factors and Comorbidities Finances    Examination-Activity Limitations Locomotion Level;Sit;Stand    Examination-Participation Restrictions Occupation;Community Activity    Stability/Clinical Decision Making Evolving/Moderate complexity    Clinical Decision Making Moderate    Rehab Potential Good    PT Frequency 1x / week   2x per week was recommended, but he reported he is unable to attend therapy more than once per week   PT Duration 6 weeks    PT Treatment/Interventions Cryotherapy;Electrical  Stimulation;Moist Heat;Gait training;Functional mobility training;Patient/family education;Therapeutic activities;Therapeutic exercise;Neuromuscular re-education;Dry needling;Passive range of motion;Manual techniques;Taping;Vasopneumatic Device    PT Next Visit Plan nustep, isometrics, light STM to left hip, and modalities as needed    PT Home Exercise Plan Access Code: [email or web address]  URL: [email or web address]  Date: 03/31/2022  Prepared by: Candi Leash    Exercises  - Hooklying Gluteal Sets  - 2 x daily - 7 x weekly - 3 sets - 10 reps - 5 seconds hold  - Seated Gluteal Sets  - 2 x daily - 7 x weekly - 3 sets - 10 reps - 5 seconds hold  - Supine Isometric Hamstring Set  - 2 x daily - 7 x weekly - 3 sets - 10 reps - 5 seconds  hold  - Seated Hamstring Set  - 2 x daily - 7 x weekly - 3 sets - 10 reps - 5 seconds hold  - Seated Slump Nerve Glide  - 2 x daily - 7 x  weekly - 3 sets - 10 reps    Consulted and Agree with Plan of Care Patient             Patient will benefit from skilled therapeutic intervention in order to improve the following deficits and impairments:  Abnormal gait, Difficulty walking, Decreased range of motion, Impaired tone, Decreased activity tolerance, Pain, Decreased balance, Hypomobility, Impaired flexibility, Impaired sensation, Decreased strength, Decreased mobility  Visit Diagnosis: Pain in left leg  Difficulty in walking, not elsewhere classified     Problem List Patient Active Problem List   Diagnosis Date Noted   Ear pain 10/14/2021   Encounter for vasectomy counseling 08/12/2021   Annual physical exam 02/06/2021   Nicotine dependence, uncomplicated [id number]   Acute pain of left knee 11/07/2020   Rationale for Evaluation and Treatment Rehabilitation   Darlin Coco, PT 03/31/2022, 4:08 PM  [facility] [address] [place], [place], [address] Phone: [phone]   Fax:  [phone]  Name:  BRYCESON BRODEUR MRN: [id number] Date of Birth: 31-Oct-1985

## 2022-04-15 ENCOUNTER — Ambulatory Visit: Payer: BC Managed Care – PPO | Attending: Family Medicine

## 2022-04-15 DIAGNOSIS — M79605 Pain in left leg: Secondary | ICD-10-CM | POA: Diagnosis not present

## 2022-04-15 DIAGNOSIS — R262 Difficulty in walking, not elsewhere classified: Secondary | ICD-10-CM | POA: Insufficient documentation

## 2022-04-15 NOTE — Therapy (Addendum)
OUTPATIENT PHYSICAL THERAPY TREATMENT NOTE   Patient Name: Jake Walsh MRN: [id number] DOB:[birth year], 36 y.o., male Today's Date: 04/15/2022  REFERRING PROVIDER: Gwenlyn Perking, FNP   PT End of Session - 04/15/22 1446     Visit Number 2    Number of Visits 6    Date for PT Re-Evaluation 06/04/22    PT Start Time 0234    PT Stop Time 0315    PT Time Calculation (min) 41 min    Activity Tolerance Patient tolerated treatment well    Behavior During Therapy [facility] for tasks assessed/performed             Past Medical History:  Diagnosis Date   Anxiety    Coarse tremors    History reviewed. No pertinent surgical history. Patient Active Problem List   Diagnosis Date Noted   Ear pain 10/14/2021   Encounter for vasectomy counseling 08/12/2021   Annual physical exam 02/06/2021   Nicotine dependence, uncomplicated 82/80/0349   Acute pain of left knee 11/07/2020    REFERRING DIAG: Left sided sciatica; Left thigh pain  THERAPY DIAG:  Pain in left leg  Difficulty in walking, not elsewhere classified  Rationale for Evaluation and Treatment Rehabilitation  PERTINENT HISTORY: none   PRECAUTIONS: none   SUBJECTIVE: Patient reports that his left leg is still hurting about the same. He notes that his HEP helps some in the morning, but it does not make a difference by the afternoon. He notes that he fell Sunday morning when he stepped out of his shower with his left foot first.   PAIN:  Are you having pain? Yes: NPRS scale: 6/10 Pain location: left hamstring     TODAY'S TREATMENT:                                   7/6  EXERCISE LOG  Exercise Repetitions and Resistance Comments  Nustep  L3 x 9 minutes    Heel raises  2 minutes    Standing knee flexion LLE; partial ROM; 20 reps    Standing weight shift  Onto LLE; 2 minutes    LAQ LLE only; 30 reps    Hamstring stretch LLE only; 2 minutes     Blank cell = exercise not performed today   Modalities  Date:   Unattended Estim: left hamstring, pre mod, 80-150 Hz, 5 mins, Pain    PATIENT EDUCATION: Education details: healing Person educated: Patient Education method: Explanation Education comprehension: verbalized understanding   HOME EXERCISE PROGRAM: [email or web address]     PT Long Term Goals - 03/31/22 1603       PT LONG TERM GOAL #1   Title Patient will be independent with his HEP    Time 6    Period Weeks    Status New    Target Date 05/12/22      PT LONG TERM GOAL #2   Title Patient will be able to return to work without having to stop due to his familiar hip pain.    Time 6    Period Weeks    Status New    Target Date 05/12/22      PT LONG TERM GOAL #3   Title Patient will be able to complete his daily activites without his pain exceeding a 5/10.    Time 6    Period Weeks    Status New    Target Date 05/12/22  PT LONG TERM GOAL #4   Title Patient will be able to ambulate without significant gait deviations for improved functional mobility.    Time 6    Period Weeks    Status New    Target Date 05/12/22              Plan - 04/15/22 1532     Clinical Impression Statement Patient was introduced to multiple new interventions in an attempt to reduce his familiar left hamstring pain. He required minimal cueing to facilitate hamstring mobility and engagement while preventing a significant increase in his familiar symptoms. Electrical stimulation was also attempted, but neither this nor today's active interventions were able to significantly reduce his familiar pain. He reported that his leg felt "stiff" upon the conclusion of treatment. He would benefit from continued physical therapy to address his remaining impairments to return to his prior level of function.     Personal Factors and Comorbidities Finances    Examination-Activity Limitations Locomotion Level;Sit;Stand    Examination-Participation Restrictions Occupation;Community Activity    Stability/Clinical  Decision Making Evolving/Moderate complexity    Rehab Potential Good    PT Frequency 1x / week   2x per week was recommended, but he reported he is unable to attend therapy more than once per week   PT Duration 6 weeks    PT Treatment/Interventions Cryotherapy;Electrical Stimulation;Moist Heat;Gait training;Functional mobility training;Patient/family education;Therapeutic activities;Therapeutic exercise;Neuromuscular re-education;Dry needling;Passive range of motion;Manual techniques;Taping;Vasopneumatic Device    PT Next Visit Plan nustep, isometrics, light STM to left hip, and modalities as needed    PT Home Exercise Plan Access Code: [email or web address]  URL: [email or web address]  Date: 03/31/2022  Prepared by: Jacqulynn Cadet    Exercises  - Hooklying Gluteal Sets  - 2 x daily - 7 x weekly - 3 sets - 10 reps - 5 seconds hold  - Seated Gluteal Sets  - 2 x daily - 7 x weekly - 3 sets - 10 reps - 5 seconds hold  - Supine Isometric Hamstring Set  - 2 x daily - 7 x weekly - 3 sets - 10 reps - 5 seconds  hold  - Seated Hamstring Set  - 2 x daily - 7 x weekly - 3 sets - 10 reps - 5 seconds hold  - Seated Slump Nerve Glide  - 2 x daily - 7 x weekly - 3 sets - 10 reps    Consulted and Agree with Plan of Care Patient            PHYSICAL THERAPY DISCHARGE SUMMARY  Visits from Start of Care: 2  Current functional level related to goals / functional outcomes: Patient was unable to meet his goals for therapy as he did not return following his last appointment.    Remaining deficits: No significant change from initial evaluation    Education / Equipment: HEP   Patient agrees to discharge. Patient goals were not met. Patient is being discharged due to not returning since the last visit.  Jacqulynn Cadet, PT, DPT     Darlin Coco, PT 04/15/2022, 4:53 PM

## 2022-04-16 ENCOUNTER — Ambulatory Visit: Payer: BC Managed Care – PPO | Admitting: Nurse Practitioner

## 2022-04-16 ENCOUNTER — Encounter: Payer: Self-pay | Admitting: Nurse Practitioner

## 2022-04-16 ENCOUNTER — Ambulatory Visit (INDEPENDENT_AMBULATORY_CARE_PROVIDER_SITE_OTHER): Payer: BC Managed Care – PPO

## 2022-04-16 VITALS — BP 143/93 | HR 93 | Temp 98.9°F | Ht 70.0 in | Wt 209.6 lb

## 2022-04-16 DIAGNOSIS — M5432 Sciatica, left side: Secondary | ICD-10-CM

## 2022-04-16 DIAGNOSIS — M549 Dorsalgia, unspecified: Secondary | ICD-10-CM | POA: Diagnosis not present

## 2022-04-16 DIAGNOSIS — M545 Low back pain, unspecified: Secondary | ICD-10-CM | POA: Diagnosis not present

## 2022-04-16 MED ORDER — METHYLPREDNISOLONE ACETATE 40 MG/ML IJ SUSP
40.0000 mg | Freq: Once | INTRAMUSCULAR | Status: AC
  Administered 2022-04-16: 80 mg via INTRAMUSCULAR

## 2022-04-16 MED ORDER — METHOCARBAMOL 500 MG PO TABS: 500.0000 mg | ORAL_TABLET | Freq: Four times a day (QID) | ORAL | 1 refills | Status: DC

## 2022-04-16 NOTE — Patient Instructions (Signed)

## 2022-04-16 NOTE — Progress Notes (Signed)
Acute Office Visit  Subjective:     Patient ID: Jake Walsh, male    DOB: [birth year], 36 y.o.   MRN: [id number]  Chief Complaint  Patient presents with   Leg Pain   Back Pain    Leg Pain  The incident occurred more than 1 week ago. The incident occurred at home. There was no injury mechanism. The pain is present in the left hip and left leg. The quality of the pain is described as aching. The pain is at a severity of 9/10. The pain is severe. Associated symptoms include an inability to bear weight, numbness and tingling. He reports no foreign bodies present. He has tried NSAIDs and rest for the symptoms.  Back Pain The current episode started more than 1 month ago. The problem occurs constantly. The problem has been gradually worsening since onset. The pain is present in the lumbar spine. The quality of the pain is described as aching and shooting. The pain is severe. The pain is The same all the time. The symptoms are aggravated by position. Stiffness is present All day. Associated symptoms include leg pain, numbness, paresthesias and tingling. Pertinent negatives include no bladder incontinence, bowel incontinence or paresis. He has tried muscle relaxant, heat and NSAIDs for the symptoms. The treatment provided no relief.     Review of Systems  Constitutional: Negative.  Negative for malaise/fatigue.  HENT: Negative.    Eyes: Negative.   Respiratory: Negative.    Cardiovascular: Negative.   Gastrointestinal:  Negative for bowel incontinence.  Genitourinary:  Negative for bladder incontinence.  Musculoskeletal:  Positive for back pain.  Skin: Negative.  Negative for rash.  Neurological:  Positive for tingling, numbness and paresthesias.  All other systems reviewed and are negative.       Objective:    BP (!) 143/93   Pulse 93   Temp 98.9 F (37.2 C)   Ht 5\' 10"  (1.778 m)   Wt 209 lb 9.6 oz (95.1 kg)   SpO2 98%   BMI 30.07 kg/m  BP Readings from Last 3 Encounters:   04/16/22 (!) 143/93  03/25/22 135/79  03/17/22 133/81   Wt Readings from Last 3 Encounters:  04/16/22 209 lb 9.6 oz (95.1 kg)  03/25/22 205 lb 8 oz (93.2 kg)  03/17/22 204 lb (92.5 kg)      Physical Exam Vitals and nursing note reviewed.  Constitutional:      Appearance: Normal appearance.  HENT:     Right Ear: External ear normal.     Left Ear: External ear normal.     Nose: Nose normal.  Eyes:     Conjunctiva/sclera: Conjunctivae normal.  Cardiovascular:     Rate and Rhythm: Normal rate and regular rhythm.     Pulses: Normal pulses.     Heart sounds: Normal heart sounds.  Pulmonary:     Effort: Pulmonary effort is normal.     Breath sounds: Normal breath sounds.  Abdominal:     General: Bowel sounds are normal.  Musculoskeletal:     Lumbar back: Tenderness present. Decreased range of motion.       Back:     Comments: Tenderness lower back, pain bilateral lower extremity  Neurological:     General: No focal deficit present.     Mental Status: He is alert and oriented to person, place, and time.  Psychiatric:        Behavior: Behavior normal.     No results found for any visits  on 04/16/22.      Assessment & Plan:  Lower back pain with bilateral sciatica symptoms not well controlled in the past 4 weeks.  Completed lumbar x-ray.  Changed Flexeril to Robaxin 500 mg tablet by mouth as needed.  Depo-Medrol given in clinic.  Continue back strengthening exercises, ice compact/heat pad as tolerated.  Patient knows to follow-up with worsening unresolved symptoms. Problem List Items Addressed This Visit   None Visit Diagnoses     Left sided sciatica    -  Primary   Relevant Medications   methylPREDNISolone acetate (DEPO-MEDROL) injection 40 mg (Completed)   methocarbamol (ROBAXIN) 500 MG tablet   Other Relevant Orders   DG Lumbar Spine 2-3 Views   Ambulatory referral to Neurosurgery   Back pain with sciatica       Relevant Medications   methylPREDNISolone  acetate (DEPO-MEDROL) injection 40 mg (Completed)   methocarbamol (ROBAXIN) 500 MG tablet   Other Relevant Orders   Ambulatory referral to Neurosurgery       Meds ordered this encounter  Medications   methylPREDNISolone acetate (DEPO-MEDROL) injection 40 mg   methocarbamol (ROBAXIN) 500 MG tablet    Sig: Take 1 tablet (500 mg total) by mouth 4 (four) times daily.    Dispense:  30 tablet    Refill:  1    Order Specific Question:   Supervising Provider    Answer:   Mechele Claude [phone]    Return if symptoms worsen or fail to improve.  Daryll Drown, NP

## 2022-04-19 ENCOUNTER — Other Ambulatory Visit: Payer: Self-pay | Admitting: Nurse Practitioner

## 2022-04-19 DIAGNOSIS — M5430 Sciatica, unspecified side: Secondary | ICD-10-CM

## 2022-04-20 ENCOUNTER — Other Ambulatory Visit: Payer: Self-pay

## 2022-04-20 ENCOUNTER — Telehealth: Payer: Self-pay

## 2022-04-20 ENCOUNTER — Telehealth: Payer: Self-pay | Admitting: Nurse Practitioner

## 2022-04-20 NOTE — Telephone Encounter (Signed)
Pt needed work note

## 2022-04-20 NOTE — Telephone Encounter (Signed)
He has referral to orthopedic, I did tell patient depending on what orthopedic decides he can talk with his HR department to see what they say, I can sign and approve for his days in the office until his appointment with ortho or Neuro which ever once comes first. Ill sign his paper work

## 2022-04-20 NOTE — Telephone Encounter (Signed)
Patient faxed over FMLA paperwork on 7/7 and was called on 7/10 to pay the fee for the paperwork. He was asked when he was going back to work but was unsure because he did not know what [employer] wanted him to do. Aware that the paperwork is not complete. Please call back and advise about patient returning to work.

## 2022-04-23 ENCOUNTER — Encounter: Payer: Self-pay | Admitting: Family Medicine

## 2022-04-23 ENCOUNTER — Ambulatory Visit: Payer: BC Managed Care – PPO | Admitting: Family Medicine

## 2022-04-23 VITALS — BP 140/80 | HR 81 | Temp 97.1°F | Ht 70.0 in | Wt 203.8 lb

## 2022-04-23 DIAGNOSIS — M5416 Radiculopathy, lumbar region: Secondary | ICD-10-CM

## 2022-04-23 DIAGNOSIS — M5442 Lumbago with sciatica, left side: Secondary | ICD-10-CM | POA: Diagnosis not present

## 2022-04-23 DIAGNOSIS — G8929 Other chronic pain: Secondary | ICD-10-CM | POA: Diagnosis not present

## 2022-04-23 NOTE — Patient Instructions (Addendum)
Use Ibuprofen 2-3 times daily WITH food.  May need to start pepcid to protect your stomach lining while we wait on your MRI/ Neurosurgery eval. Send FMLA form to me.  NO lifting/ pushing/ pulling >10lbs. Heat to low back to relax muscles.  Lumbosacral Radiculopathy Lumbosacral radiculopathy is a condition that involves the spinal nerves and nerve roots in the low back and bottom of the spine. The condition develops when these nerves and nerve roots move out of place or become inflamed and cause symptoms. What are the causes? This condition may be caused by: Pressure from a disk that bulges out of place (herniated disk). A disk is a plate of soft cartilage that separates bones in the spine. Disk changes that occur with age (disk degeneration). A narrowing of the bones of the lower back (spinal stenosis). A tumor. An infection. An injury that places sudden pressure on the disks that cushion the bones of your lower spine. What increases the risk? You are more likely to develop this condition if: You are a male who is 34-51 years old. You are a male who is 64-39 years old. You use improper technique when lifting things. You are overweight or live a sedentary lifestyle. You smoke. Your work requires frequent lifting. You do repetitive activities that strain the spine. What are the signs or symptoms? Symptoms of this condition include: Pain that goes down from your back into your legs (sciatica), usually on one side of the body. This is the most common symptom. The pain may be worse when you sit, cough, or sneeze. Tingling and numbness in your legs. Muscle weakness in your legs. Loss of bladder control or bowel control. How is this diagnosed? This condition may be diagnosed based on: Your symptoms and medical history. A physical exam. If the pain lasts, you may have tests, such as: MRI. X-ray. CT scan. A type of CT scan used to examine the spinal canal after injecting a dye into your  spine (myelogram). A test to measure how electrical impulses move through a nerve (nerve conduction study). A test to measure the electrical activity in muscles (electromyogram). How is this treated? In many cases, treatment is not needed for this condition. With rest, the condition usually gets better over time. If treatment is needed, it may include: Working with a physical therapist to improve strength and flexibility. Taking pain medicine. Applying heat or ice or both to the affected areas. Having chiropractic spinal manipulation. Using transcutaneous electrical nerve stimulation (TENS) therapy. Getting a steroid injection in the spine. Having surgery. This may be needed if other treatments do not help. Different types of surgery may be done depending on the cause of this condition. Follow these instructions at home: Activity Avoid bending and any other activities that make the problem worse. Maintain a proper position when standing or sitting. When standing, keep your upper back and neck straight with your shoulders pulled back. Avoid slouching. When sitting, keep your back straight and relax your shoulders. Do not round your shoulders or pull them backward. Do not sit or stand in one place for long periods of time. Take brief periods of rest throughout the day. This will reduce your pain. It is usually better to rest by lying down or standing, not sitting. Mix in mild activity or stretching between long periods of rest. This will help to prevent stiffness and pain. Get regular exercise. Ask your health care provider what activities are safe for you. If you were shown how to  do any exercises or stretches, do them as told by your health care provider. You may have to avoid lifting. Ask your health care provider how much you can safely lift. Always use proper lifting technique, which includes: Bending your knees. Keeping the load close to your body. Avoiding twisting. Managing pain If  directed, put ice on the affected area. To do this: Put ice in a plastic bag. Place a towel between your skin and the bag. Leave the ice on for 20 minutes, 2-3 times a day. Remove the ice if your skin turns bright red. This is very important. If you cannot feel pain, heat, or cold, you have a greater risk of damage to the area. If directed, apply heat to the affected area as often as told by your health care provider. Use the heat source that your health care provider recommends, such as a moist heat pack or a heating pad. Place a towel between your skin and the heat source. Leave the heat on for 20-30 minutes. Remove the heat if your skin turns bright red. This is especially important if you are unable to feel pain, heat, or cold. You have a greater risk of getting burned. Take over-the-counter and prescription medicines only as told by your health care provider. General instructions Sleep on a firm mattress in a comfortable position. Try lying on your side with your knees slightly bent. If you lie on your back, put a pillow under your knees. Ask your health care provider if the medicine prescribed to you requires you to avoid driving or using machinery. If your health care provider prescribed a diet or exercise program, follow it as told. Keep all follow-up visits. This is important. Contact a health care provider if: Your pain does not get better over time, even when taking pain medicines. Get help right away if: You develop severe pain. Your pain suddenly gets worse. You develop increasing weakness in your legs. You lose the ability to control your bladder or bowel. You have difficulty walking or balancing. You have a fever. Summary Lumbosacral radiculopathy is a condition that occurs when the spinal nerves and nerve roots in the lower part of the spine move out of place or become inflamed and cause symptoms. Symptoms include pain, numbness, and tingling that go down from your back into  your legs (sciatica), muscle weakness, and loss of bladder control or bowel control. If directed, apply ice or heat or both to the affected area as told by your health care provider. Follow instructions about activity, rest, and proper lifting technique. This information is not intended to replace advice given to you by your health care provider. Make sure you discuss any questions you have with your health care provider. Document Revised: 04/02/2021 Document Reviewed: 04/02/2021 Elsevier Patient Education  2023 [employer].

## 2022-04-23 NOTE — Progress Notes (Signed)
Subjective: CC: Low back pain with sciatica PCP: Jake Drown, NP Jake Walsh is a 36 y.o. male presenting to clinic today for:  1.  Low back pain with sciatica Patient notes that he has been dealing with this for almost 2 months now.  He has been seen in the office 4 times for this issue.  He has undergone physical therapy and initially it was not necessarily helping but not necessarily harming.  On his last checkup they did some type of electrode treatment and that caused severe stiffness and pain.  He notes that the pain is primarily in the left low back, radiating into the buttock, hip and down the left lower leg into the arch of his foot.  He denies any preceding injury except for a fall that was sustained about a year ago in the shower.  He admits that he has had some intermittent left lower leg weakness since that time but had not really had any pain.  He denies any fecal incontinence, urinary retention or saddle anesthesia.  He does report numbness and tingling that comes and goes down the left lower extremity.  The leg continues to give out from underneath him spontaneously.  He has used a couple of different NSAIDs including Advil and Voltaren, neither of which really were helpful.  He has been on prednisone Dosepaks as well as intramuscular injections which were somewhat helpful but always wore off.  He has been utilizing methocarbamol currently for pain relief which has been the most helpful of all the medications.  Flexeril was ineffective.  He is currently an employee at [employer] and works on the [employer].  His job entails walking a fairly large circuit repetitively.  He is very concerned about falling at work and then not being able to get the appropriate help because he "now has a pre-existing condition".   ROS: Per HPI  No Known Allergies Past Medical History:  Diagnosis Date   Anxiety    Coarse tremors     Current Outpatient Medications:    diclofenac (VOLTAREN) 75  MG EC tablet, Take 1 tablet (75 mg total) by mouth 2 (two) times daily., Disp: 30 tablet, Rfl: 0   ibuprofen (ADVIL) 600 MG tablet, Take 1 tablet (600 mg total) by mouth every 8 (eight) hours as needed., Disp: 30 tablet, Rfl: 0   methocarbamol (ROBAXIN) 500 MG tablet, Take 1 tablet (500 mg total) by mouth 4 (four) times daily., Disp: 30 tablet, Rfl: 1   omeprazole (PRILOSEC) 20 MG capsule, Take 1 capsule (20 mg total) by mouth daily. (Patient not taking: Reported on 03/25/2022), Disp: 30 capsule, Rfl: 3 Social History   Socioeconomic History   Marital status: Married    Spouse name: Enrique Sack   Number of children: 3   Years of education: Not on file   Highest education level: Some college, no degree  Occupational History   Not on file  Tobacco Use   Smoking status: Every Day    Packs/day: 0.25    Types: Cigarettes   Smokeless tobacco: Never  Vaping Use   Vaping Use: Every day   Substances: Nicotine  Substance and Sexual Activity   Alcohol use: No    Comment: occ   Drug use: No   Sexual activity: Yes    Birth control/protection: None  Other Topics Concern   Not on file  Social History Narrative   Not on file   Social Determinants of Health   Financial Resource Strain:  Not on file  Food Insecurity: Not on file  Transportation Needs: Not on file  Physical Activity: Not on file  Stress: Not on file  Social Connections: Not on file  Intimate Partner Violence: Not on file   Family History  Problem Relation Age of Onset   Arthritis/Rheumatoid Mother    Gout Mother    Arthritis Mother    Parkinson's disease Father    Diabetes Father    Bipolar disorder Father    Gallstones Sister    Hodgkin's lymphoma Sister    ADD / ADHD Son    ODD Son     Objective: Office vital signs reviewed. BP 140/86   Pulse 81   Temp (!) 97.1 F (36.2 C)   Ht 5\' 10"  (1.778 m)   Wt 203 lb 12.8 oz (92.4 kg)   SpO2 99%   BMI 29.24 kg/m   Physical Examination:  General: Awake, alert, well  nourished, No acute distress MSK: Very antalgic gait and abnormal station  Lumbar spine: No midline tenderness palpation.  He has significant paraspinal muscle tenderness palpation, particularly along the L3-L5 regions on the left.  This radiates down into the buttock where he is also tender.  He has a positive straight leg raise on the left. Skin: dry; intact; no rashes or lesions Neuro: 4/5 left lower extremity strength in flexion, extension, abduction and adduction. light touch sensation grossly intact, patellar DTRs 2/4  DG Lumbar Spine 2-3 Views  Result Date: 04/17/2022 CLINICAL DATA:  Back pain EXAM: LUMBAR SPINE - 2-3 VIEW COMPARISON:  None Available. FINDINGS: Mild levocurvature. Sagittal alignment within normal limits. Vertebral body heights and disc spaces appear within normal limits. IMPRESSION: Mild levocurvature.  Otherwise negative Electronically Signed   By: 06/18/2022 M.D.   On: 04/17/2022 23:29    Assessment/ Plan: 36 y.o. male   Lumbar radiculopathy - Plan: MR Lumbar Spine Wo Contrast  Chronic left-sided low back pain with left-sided sciatica - Plan: MR Lumbar Spine Wo Contrast  Based on his exam today I am very suspicious for a slipped disc.  I reviewed his x-ray which demonstrated no significant degenerative changes and fairly preserved vertebral bodies and heights.  He had a positive straight leg raise, weakness in all planes of the left lower extremity.  DTRs were intact bilaterally.  I have ordered an ASAP MRI of the lumbar spine and we will plan to CC this to the referral coordinator wants the results have come back.  He has already been referred to neurosurgery but I suspect that the hold-up has been the lack of MRI prior to that appointment.  Advised to hold off on physical therapy for now since this exacerbated symptoms.  I have given him a work note excusing him.  If he is able to perform light duties where he is seated then he is welcome to but it sounds like that is  not something they can accommodate at this time.  For this reason I will plan to write him out and fill out the appropriate FMLA paperwork until he can be appropriately assessed by the specialist.  He voiced good understanding of the plan.  He understands red flag signs and symptoms and a handout was provided today  No orders of the defined types were placed in this encounter.  No orders of the defined types were placed in this encounter.   34, DO [facility] [phone]

## 2022-04-29 ENCOUNTER — Encounter: Payer: PRIVATE HEALTH INSURANCE | Admitting: *Deleted

## 2022-04-30 ENCOUNTER — Telehealth: Payer: Self-pay

## 2022-04-30 NOTE — Telephone Encounter (Signed)
Unfortunately, have no idea about this.  Will cc Courtney.  Likely an insurance issue.

## 2022-04-30 NOTE — Telephone Encounter (Signed)
Pt wife is concerned that mri has not been scheduled yet. Spine doctor is waiting on mri results- pt fell 4 times just this morning. Can we get an update on scheduling mri???

## 2022-05-03 NOTE — Telephone Encounter (Signed)
Pt aware of recommdations

## 2022-05-06 ENCOUNTER — Encounter: Payer: Self-pay | Admitting: Family Medicine

## 2022-05-10 ENCOUNTER — Ambulatory Visit (HOSPITAL_COMMUNITY)
Admission: RE | Admit: 2022-05-10 | Discharge: 2022-05-10 | Disposition: A | Discharge status: Home / Self Care | Payer: BC Managed Care – PPO | Source: Ambulatory Visit | Attending: Family Medicine | Admitting: Family Medicine

## 2022-05-10 DIAGNOSIS — M5442 Lumbago with sciatica, left side: Secondary | ICD-10-CM | POA: Insufficient documentation

## 2022-05-10 DIAGNOSIS — M5416 Radiculopathy, lumbar region: Secondary | ICD-10-CM | POA: Diagnosis not present

## 2022-05-10 DIAGNOSIS — G8929 Other chronic pain: Secondary | ICD-10-CM | POA: Insufficient documentation

## 2022-05-10 DIAGNOSIS — M545 Low back pain, unspecified: Secondary | ICD-10-CM | POA: Diagnosis not present

## 2022-05-10 DIAGNOSIS — M4186 Other forms of scoliosis, lumbar region: Secondary | ICD-10-CM | POA: Diagnosis not present

## 2022-05-12 ENCOUNTER — Ambulatory Visit: Payer: BC Managed Care – PPO | Admitting: Family Medicine

## 2022-05-12 ENCOUNTER — Encounter: Payer: Self-pay | Admitting: Family Medicine

## 2022-05-12 VITALS — BP 128/89 | HR 94 | Temp 98.0°F | Ht 70.0 in | Wt 203.8 lb

## 2022-05-12 DIAGNOSIS — M5442 Lumbago with sciatica, left side: Secondary | ICD-10-CM

## 2022-05-12 DIAGNOSIS — R531 Weakness: Secondary | ICD-10-CM | POA: Diagnosis not present

## 2022-05-12 DIAGNOSIS — G8929 Other chronic pain: Secondary | ICD-10-CM

## 2022-05-12 MED ORDER — GABAPENTIN 600 MG PO TABS: 300.0000 mg | ORAL_TABLET | Freq: Three times a day (TID) | ORAL | 2 refills | Status: DC | PRN

## 2022-05-12 NOTE — Progress Notes (Signed)
Subjective: CC: Follow-up low back pain with left leg radiculopathy PCP: Janora Norlander, DO Jake Walsh is a 36 y.o. male presenting to clinic today for:  1.  Low back pain with left lower extremity radiculopathy Patient is accompanied today's visit by his wife and son.  He notes that symptoms have gotten progressive since our last visit despite being off of his feet and off of work for the last several weeks.  He notes that his leg gives out more from underneath him now.  He continues to have pain that radiates to the posterior thigh with ambulation.  He has ongoing limp.  Denies any knee pain, swelling or any foot pain or swelling that might be causing gait abnormality.  He does not report any change in urination or bowel habits.  The Robaxin was ineffective so he discontinued.  No known family history of multiple sclerosis but he reports a family history of Parkinson disease in his father.  He has not yet seen a spinal specialist as they were waiting on the lumbar imaging   ROS: Per HPI  No Known Allergies Past Medical History:  Diagnosis Date   Anxiety    Coarse tremors     Current Outpatient Medications:    diclofenac (VOLTAREN) 75 MG EC tablet, Take 1 tablet (75 mg total) by mouth 2 (two) times daily., Disp: 30 tablet, Rfl: 0   gabapentin (NEURONTIN) 600 MG tablet, Take 0.5-1 tablets (300-600 mg total) by mouth 3 (three) times daily as needed (pain)., Disp: 90 tablet, Rfl: 2   ibuprofen (ADVIL) 600 MG tablet, Take 1 tablet (600 mg total) by mouth every 8 (eight) hours as needed., Disp: 30 tablet, Rfl: 0   methocarbamol (ROBAXIN) 500 MG tablet, Take 1 tablet (500 mg total) by mouth 4 (four) times daily., Disp: 30 tablet, Rfl: 1   omeprazole (PRILOSEC) 20 MG capsule, Take 1 capsule (20 mg total) by mouth daily. (Patient not taking: Reported on 03/25/2022), Disp: 30 capsule, Rfl: 3 Social History   Socioeconomic History   Marital status: Married    Spouse name: Tillie Rung    Number of children: 3   Years of education: Not on file   Highest education level: Some college, no degree  Occupational History   Not on file  Tobacco Use   Smoking status: Every Day    Packs/day: 0.25    Types: Cigarettes   Smokeless tobacco: Never  Vaping Use   Vaping Use: Every day   Substances: Nicotine  Substance and Sexual Activity   Alcohol use: No    Comment: occ   Drug use: No   Sexual activity: Yes    Birth control/protection: None  Other Topics Concern   Not on file  Social History Narrative   Not on file   Social Determinants of Health   Financial Resource Strain: Not on file  Food Insecurity: Not on file  Transportation Needs: Not on file  Physical Activity: Not on file  Stress: Not on file  Social Connections: Not on file  Intimate Partner Violence: Not on file   Family History  Problem Relation Age of Onset   Arthritis/Rheumatoid Mother    Gout Mother    Arthritis Mother    Parkinson's disease Father    Diabetes Father    Bipolar disorder Father    Gallstones Sister    Hodgkin's lymphoma Sister    ADD / ADHD Son    ODD Son     Objective: Office vital  signs reviewed. BP 128/89   Pulse 94   Temp 98 F (36.7 C)   Ht _0  (1.778 m)   Wt 203 lb 12.8 oz (92.4 kg)   SpO2 97%   BMI 29.24 kg/m   Physical Examination:  General: Awake, alert, nontoxic male, No acute distress MSK: Antalgic gait and station and visibly limping/dragging his left lower extremity behind him Neuro: 4/5 left lower extremity strength.  Assessment/ Plan: 36 y.o. male   Chronic left-sided low back pain with left-sided sciatica - Plan: MR Brain Wo Contrast, MR Cervical Spine Wo Contrast, MR Thoracic Spine Wo Contrast, gabapentin (NEURONTIN) 600 MG tablet  Weakness - Plan: MR Brain Wo Contrast, MR Cervical Spine Wo Contrast, MR Thoracic Spine Wo Contrast, gabapentin (NEURONTIN) 600 MG tablet, CMP14+EGFR, CBC with Differential, ANA w/Reflex if Positive, Sedimentation  Rate, C-reactive protein, TSH, T4, Free  I am quite confounded by his recent lumbar imaging as it showed no etiology for his ongoing symptoms.  He has weakness on exam and a visible limp where he is dragging hips left lower extremity from behind him.  He is clearly having buckling of the left lower extremity.  I am now concerned that perhaps he has some type of underlying demyelinating disease and for this reason I am going to proceed with more advanced imaging of the entire spine.  We will collect labs to look for any unusual findings as well today.  Plan to write him out of work at least until we can get imaging results back.  Hopefully we can reassess in the next 3 weeks.  I have sent in gabapentin in efforts to try and alleviate some of the discomfort.  Orders Placed This Encounter  Procedures   MR Brain Wo Contrast    Standing Status:   Future    Standing Expiration Date:   05/13/2023    Order Specific Question:   What is the patient's sedation requirement?    Answer:   No Sedation    Order Specific Question:   Does the patient have a pacemaker or implanted devices?    Answer:   No    Order Specific Question:   Preferred imaging location?    Answer:   Internal   MR Cervical Spine Wo Contrast    Standing Status:   Future    Standing Expiration Date:   05/13/2023    Order Specific Question:   What is the patient's sedation requirement?    Answer:   No Sedation    Order Specific Question:   Does the patient have a pacemaker or implanted devices?    Answer:   No    Order Specific Question:   Preferred imaging location?    Answer:   Internal   MR Thoracic Spine Wo Contrast    Standing Status:   Future    Standing Expiration Date:   05/13/2023    Order Specific Question:   What is the patient's sedation requirement?    Answer:   No Sedation    Order Specific Question:   Does the patient have a pacemaker or implanted devices?    Answer:   No    Order Specific Question:   Preferred imaging  location?    Answer:   Internal   CMP14+EGFR   CBC with Differential   ANA w/Reflex if Positive   Sedimentation Rate   C-reactive protein   TSH   T4, Free   Meds ordered this encounter  Medications   gabapentin (  NEURONTIN) 600 MG tablet    Sig: Take 0.5-1 tablets (300-600 mg total) by mouth 3 (three) times daily as needed (pain).    Dispense:  90 tablet    Refill:  [place], [place] [phone]

## 2022-05-12 NOTE — Patient Instructions (Signed)
I'm looking for demyelinating diseases (like multiple sclerosis). Your spinal imaging thus far is not making sense given your ongoing and progressive symptoms

## 2022-05-13 LAB — CMP14+EGFR
ALT: 28 IU/L (ref 0–44)
AST: 17 IU/L (ref 0–40)
Albumin/Globulin Ratio: 2 (ref 1.2–2.2)
Albumin: 4.9 g/dL (ref 4.1–5.1)
Alkaline Phosphatase: 68 IU/L (ref 44–121)
BUN/Creatinine Ratio: 14 (ref 9–20)
BUN: 11 mg/dL (ref 6–20)
Bilirubin Total: 0.3 mg/dL (ref 0.0–1.2)
CO2: 24 mmol/L (ref 20–29)
Calcium: 9.7 mg/dL (ref 8.7–10.2)
Chloride: 100 mmol/L (ref 96–106)
Creatinine, Ser: 0.81 mg/dL (ref 0.76–1.27)
Globulin, Total: 2.4 g/dL (ref 1.5–4.5)
Glucose: 78 mg/dL (ref 70–99)
Potassium: 5 mmol/L (ref 3.5–5.2)
Sodium: 142 mmol/L (ref 134–144)
Total Protein: 7.3 g/dL (ref 6.0–8.5)
eGFR: 119 mL/min/{1.73_m2} (ref >59)

## 2022-05-13 LAB — CBC WITH DIFFERENTIAL/PLATELET
Basophils Absolute: 0.1 10*3/uL (ref 0.0–0.2)
Basos: 1 %
EOS (ABSOLUTE): 0.2 10*3/uL (ref 0.0–0.4)
Eos: 2 %
Hematocrit: 44.4 % (ref 37.5–51.0)
Hemoglobin: 15.2 g/dL (ref 13.0–17.7)
Immature Grans (Abs): 0 10*3/uL (ref 0.0–0.1)
Immature Granulocytes: 0 %
Lymphocytes Absolute: 2.9 10*3/uL (ref 0.7–3.1)
Lymphs: 30 %
MCH: 28.4 pg (ref 26.6–33.0)
MCHC: 34.2 g/dL (ref 31.5–35.7)
MCV: 83 fL (ref 79–97)
Monocytes Absolute: 0.8 10*3/uL (ref 0.1–0.9)
Monocytes: 8 %
Neutrophils Absolute: 5.8 10*3/uL (ref 1.4–7.0)
Neutrophils: 59 %
Platelets: 240 10*3/uL (ref 150–450)
RBC: 5.35 x10E6/uL (ref 4.14–5.80)
RDW: 13.7 % (ref 11.6–15.4)
WBC: 9.8 10*3/uL (ref 3.4–10.8)

## 2022-05-13 LAB — SEDIMENTATION RATE: Sed Rate: 2 mm/hr (ref 0–15)

## 2022-05-13 LAB — TSH: TSH: 0.74 u[IU]/mL (ref 0.450–4.500)

## 2022-05-13 LAB — ANA W/REFLEX IF POSITIVE: Anti Nuclear Antibody (ANA): NEGATIVE

## 2022-05-13 LAB — T4, FREE: Free T4: 1.13 ng/dL (ref 0.82–1.77)

## 2022-05-13 LAB — C-REACTIVE PROTEIN: CRP: 1 mg/L (ref 0–10)

## 2022-05-24 DIAGNOSIS — M5442 Lumbago with sciatica, left side: Secondary | ICD-10-CM | POA: Diagnosis not present

## 2022-05-24 DIAGNOSIS — Z6830 Body mass index (BMI) 30.0-30.9, adult: Secondary | ICD-10-CM | POA: Diagnosis not present

## 2022-06-02 ENCOUNTER — Ambulatory Visit: Payer: BC Managed Care – PPO | Admitting: Family Medicine

## 2022-06-02 ENCOUNTER — Encounter: Payer: Self-pay | Admitting: Family Medicine

## 2022-06-02 VITALS — BP 129/79 | HR 79 | Temp 98.7°F | Ht 70.0 in | Wt 216.0 lb

## 2022-06-02 DIAGNOSIS — G8929 Other chronic pain: Secondary | ICD-10-CM

## 2022-06-02 DIAGNOSIS — G4452 New daily persistent headache (NDPH): Secondary | ICD-10-CM

## 2022-06-02 DIAGNOSIS — H539 Unspecified visual disturbance: Secondary | ICD-10-CM

## 2022-06-02 DIAGNOSIS — M5442 Lumbago with sciatica, left side: Secondary | ICD-10-CM

## 2022-06-02 NOTE — Progress Notes (Signed)
Subjective: CC: Follow-up left lower extremity pain and weakness PCP: Raliegh Ip, DO [id number] Jake Walsh is a 36 y.o. male presenting to clinic today for:  1.  Left lower extremity pain and weakness Patient here for 3-week follow-up.  He unfortunately still has not had his MRI but this is scheduled for the 28th.  He has seen the neurosurgeon and they were quite confounded as well as to what the etiology of his symptoms were but did agree that he has some decreased reflexes in the left side of his body.  They are planning for nerve conduction study pending MRI results.  He is out of work until 10 September currently.  He continues to want to go back to work but they have not accommodated him with any light duties.  He is using cane for ambulation.  He did sustain a fall since her last visit where his leg buckled when he was leaving his grandfather's house.  This was apparently the day of his neurology visit.  He also reports 1 episode of urinary incontinence.  Has had some issues with constipation but denies any saddle anesthesia, fecal incontinence or urinary retention   ROS: Per HPI  No Known Allergies Past Medical History:  Diagnosis Date   Anxiety    Coarse tremors     Current Outpatient Medications:    gabapentin (NEURONTIN) 600 MG tablet, Take 0.5-1 tablets (300-600 mg total) by mouth 3 (three) times daily as needed (pain)., Disp: 90 tablet, Rfl: 2   ibuprofen (ADVIL) 600 MG tablet, Take 1 tablet (600 mg total) by mouth every 8 (eight) hours as needed., Disp: 30 tablet, Rfl: 0   omeprazole (PRILOSEC) 20 MG capsule, Take 1 capsule (20 mg total) by mouth daily., Disp: 30 capsule, Rfl: 3 Social History   Socioeconomic History   Marital status: Married    Spouse name: Enrique Sack   Number of children: 3   Years of education: Not on file   Highest education level: Some college, no degree  Occupational History   Not on file  Tobacco Use   Smoking status: Every Day     Packs/day: 0.25    Types: Cigarettes   Smokeless tobacco: Never  Vaping Use   Vaping Use: Every day   Substances: Nicotine  Substance and Sexual Activity   Alcohol use: No    Comment: occ   Drug use: No   Sexual activity: Yes    Birth control/protection: None  Other Topics Concern   Not on file  Social History Narrative   Not on file   Social Determinants of Health   Financial Resource Strain: Not on file  Food Insecurity: Not on file  Transportation Needs: Not on file  Physical Activity: Not on file  Stress: Not on file  Social Connections: Not on file  Intimate Partner Violence: Not on file   Family History  Problem Relation Age of Onset   Arthritis/Rheumatoid Mother    Gout Mother    Arthritis Mother    Parkinson's disease Father    Diabetes Father    Bipolar disorder Father    Gallstones Sister    Hodgkin's lymphoma Sister    ADD / ADHD Son    ODD Son     Objective: Office vital signs reviewed. BP 129/79   Pulse 79   Temp 98.7 F (37.1 C)   Ht 5\' 10"  (1.778 m)   Wt 216 lb (98 kg)   SpO2 98%   BMI 30.99 kg/m  Physical Examination:  General: Awake, alert, well nourished, No acute distress HEENT: Moist mucous membranes.  PERRLA, EOMI MSK: Antalgic gait and station requiring a cane for ambulation Neuro: Decreased strength and sensation in that left lower extremity, particularly along the calf region posteriorly.  He is alert and oriented.  Cranial nerves II through XII grossly intact  Assessment/ Plan: 36 y.o. male   Chronic left-sided low back pain with left-sided sciatica  Vision changes  New daily persistent headache  Has MRI of brain, C-spine and thoracic spine scheduled in 5 days.  Remains out of work with plan to return 06/20/2022.  He is very eager to come pack but they have not accommodated him with any seated activities that he can do at work.  He will be following up with neurosurgery on 6 September for reevaluation, review of imaging and  possible nerve conduction studies as his exam was abnormal on their checkup.  I am concerned about the vision changes and new headache that he is experiencing.  Hopefully the MRIs will be revealing.  Advised to follow-up with his eye doctor as well for eye checkup as its been several years since he has had any evaluation.  Currently wearing glasses  No orders of the defined types were placed in this encounter.  No orders of the defined types were placed in this encounter.    Raliegh Ip, DO [facility] [phone]

## 2022-06-07 ENCOUNTER — Ambulatory Visit (HOSPITAL_COMMUNITY)
Admission: RE | Admit: 2022-06-07 | Discharge: 2022-06-07 | Disposition: A | Discharge status: Home / Self Care | Payer: BC Managed Care – PPO | Source: Ambulatory Visit | Attending: Family Medicine | Admitting: Family Medicine

## 2022-06-07 DIAGNOSIS — R531 Weakness: Secondary | ICD-10-CM

## 2022-06-07 DIAGNOSIS — M47812 Spondylosis without myelopathy or radiculopathy, cervical region: Secondary | ICD-10-CM | POA: Diagnosis not present

## 2022-06-07 DIAGNOSIS — M549 Dorsalgia, unspecified: Secondary | ICD-10-CM | POA: Diagnosis not present

## 2022-06-07 DIAGNOSIS — M5442 Lumbago with sciatica, left side: Secondary | ICD-10-CM | POA: Insufficient documentation

## 2022-06-07 DIAGNOSIS — G8929 Other chronic pain: Secondary | ICD-10-CM

## 2022-06-07 DIAGNOSIS — R519 Headache, unspecified: Secondary | ICD-10-CM | POA: Diagnosis not present

## 2022-06-07 DIAGNOSIS — R262 Difficulty in walking, not elsewhere classified: Secondary | ICD-10-CM | POA: Diagnosis not present

## 2022-06-07 DIAGNOSIS — M47814 Spondylosis without myelopathy or radiculopathy, thoracic region: Secondary | ICD-10-CM | POA: Diagnosis not present

## 2022-06-21 DIAGNOSIS — M5442 Lumbago with sciatica, left side: Secondary | ICD-10-CM | POA: Diagnosis not present

## 2022-06-21 DIAGNOSIS — Z6830 Body mass index (BMI) 30.0-30.9, adult: Secondary | ICD-10-CM | POA: Diagnosis not present

## 2022-06-22 ENCOUNTER — Encounter: Payer: Self-pay | Admitting: Neurology

## 2022-06-24 ENCOUNTER — Encounter: Payer: Self-pay | Admitting: Family Medicine

## 2022-07-28 ENCOUNTER — Encounter: Payer: Self-pay | Admitting: Family Medicine

## 2022-07-28 ENCOUNTER — Ambulatory Visit: Payer: BC Managed Care – PPO | Admitting: Family Medicine

## 2022-07-28 VITALS — BP 138/87 | HR 105 | Temp 98.0°F | Ht 70.0 in | Wt 216.0 lb

## 2022-07-28 DIAGNOSIS — M7989 Other specified soft tissue disorders: Secondary | ICD-10-CM

## 2022-07-28 NOTE — Telephone Encounter (Signed)
Please have DOD check this for him.

## 2022-07-28 NOTE — Progress Notes (Signed)
BP 138/87   Pulse (!) 105   Temp 98 F (36.7 C)   Ht 5\' 10"  (1.778 m)   Wt 216 lb (98 kg)   SpO2 98%   BMI 30.99 kg/m    Subjective:   Patient ID: , male    DOB: 1986-08-11, 36 y.o.   MRN: [id number]  HPI: Jake Walsh is a 36 y.o. male presenting on 07/28/2022 for Mass (X2. LLE, no pain, enlarging. Has an appt with Neuro on 11/10 for gait/leg concerns)   HPI Left leg knots Patient has 2 left leg are soft.  She is noticeably swollen and The leg is bothering him from his back issues but he just noticed them over the past 2 days and wanted to come get them checked out because they were worrisome especially being on that leg.  He does say he has some swelling in that leg from walking around on concrete surfaces and especially since he is lifting and walking funny with his back issue.  He denies any head trauma that brought this on.  He denies any pain she says it is a little bit swollen.  Relevant past medical, surgical, family and social history reviewed and updated as indicated. Interim medical history since our last visit reviewed. Allergies and medications reviewed and updated.  Review of Systems  Constitutional:  Negative for chills and fever.  Respiratory:  Negative for shortness of breath and wheezing.   Cardiovascular:  Positive for leg swelling. Negative for chest pain.  Musculoskeletal:  Negative for back pain and gait problem.  Skin:  Negative for rash.  All other systems reviewed and are negative.   Per HPI unless specifically indicated above   Allergies as of 07/28/2022   No Known Allergies      Medication List        Accurate as of July 28, 2022  1:49 PM. If you have any questions, ask your nurse or doctor.          gabapentin 600 MG tablet Commonly known as: Neurontin Take 0.5-1 tablets (300-600 mg total) by mouth 3 (three) times daily as needed (pain).   ibuprofen 600 MG tablet Commonly known as: ADVIL Take 1 tablet (600  mg total) by mouth every 8 (eight) hours as needed.   omeprazole 20 MG capsule Commonly known as: PRILOSEC Take 1 capsule (20 mg total) by mouth daily.         Objective:   BP 138/87   Pulse (!) 105   Temp 98 F (36.7 C)   Ht 5\' 10"  (1.778 m)   Wt 216 lb (98 kg)   SpO2 98%   BMI 30.99 kg/m   Wt Readings from Last 3 Encounters:  07/28/22 216 lb (98 kg)  06/02/22 216 lb (98 kg)  05/12/22 203 lb 12.8 oz (92.4 kg)    Physical Exam Vitals and nursing note reviewed.  Constitutional:      Appearance: Normal appearance.  Musculoskeletal:        General: Swelling (Trace left lower extremity edema with 2 previous prominences on the anterior left lower leg, soft and mobile and nontender) present.  Neurological:     Mental Status: He is alert.       Assessment & Plan:   Problem List Items Addressed This Visit   None Visit Diagnoses     Leg swelling    -  Primary   Relevant Orders   06/04/22 Venous Img Lower Unilateral Left  Likely venous prominence such as venous varicosity but will send for ultrasound to be sure that there is no signs of deep vein thrombosis.  Follow up plan: Return if symptoms worsen or fail to improve.  Counseling provided for all of the vaccine components Orders Placed This Encounter  Procedures   US Venous Img Lower Unilateral Left    Caryl Pina, MD [facility] 07/28/2022, 1:49 PM

## 2022-08-16 ENCOUNTER — Encounter: Payer: Self-pay | Admitting: Family Medicine

## 2022-08-16 NOTE — Telephone Encounter (Signed)
I see an [place] for DVT eval ordered 10/18.  Loma Sousa, do you know if this was arranged already?

## 2022-08-17 NOTE — Progress Notes (Signed)
Initial neurology clinic note  TRUETT MCFARLAN MRN: [id number] DOB: 29-Oct-1985  Referring provider: Lisbeth Renshaw, MD  Primary care provider: Raliegh Ip, DO  Reason for consult:  left sided weakness  Subjective:  This is Mr. Jake Walsh, a 36 y.o. right-handed male with a medical history of smoker who presents to neurology clinic with left sided weakness. The patient is alone today.  Patient first noticed symptoms with pain in left leg about 1 year ago. He first noticed that electric pain behind his knee. It would shot up into his low back and into the arch of the left foot. The pain would come and go. It has been the same since onset. About 5 months ago he started that the left leg would buckle. He was working and felt his left knee was giving out. He had to grab the table to keep from falling. It was a sudden weakness. He tried stretching and moving his leg. It took about 15 minutes the first time to get strength back to normal. The next day, a similar episode occurred and it took 30-45 minutes to get strength back. It continued to progress until it became constant about 2-3 weeks later. The weakness is mostly behind the knee. He walks with his leg stiff because he is afraid he is going to fall. He has fallen about 20 times in the last 4 months because of the weakness. Other than the radiating pain up, he denies significant pain in his back.  He denies symptoms in his right leg or right arm. He does have fingers that lock up in the left hand (digits 3-5). He is dropping things in his left hand and it feels weak. He states he has very little sensation in his hands since a teenager.  Patient was seen by [facility] previously (exam by [facility] was 5/5). MRI brain and spinal cord showed no significant abnormalities to explain symptoms. Patient was then referred to neurology for further evaluation.  Patient is currently on gabapentin 300 mg about every 4 hours  (3 times a day). This does helps with the pain.   He went to PT as well. He did not want to put weight on that leg during therapy though, so he did not benefit well.  Patient's father has parkinson's, so he was worried something like that was happening to him.  EtOH use: very rare (1-2 shot every 3 months) Restrictive diet: No  MEDICATIONS:  Outpatient Encounter Medications as of 08/20/2022  Medication Sig   ibuprofen (ADVIL) 600 MG tablet Take 1 tablet (600 mg total) by mouth every 8 (eight) hours as needed.   gabapentin (NEURONTIN) 600 MG tablet Take 0.5-1 tablets (300-600 mg total) by mouth 3 (three) times daily as needed (pain). (Patient not taking: Reported on 08/20/2022)   omeprazole (PRILOSEC) 20 MG capsule Take 1 capsule (20 mg total) by mouth daily. (Patient not taking: Reported on 08/20/2022)   No facility-administered encounter medications on file as of 08/20/2022.    PAST MEDICAL HISTORY: Past Medical History:  Diagnosis Date   Anxiety    Coarse tremors     PAST SURGICAL HISTORY: History reviewed. No pertinent surgical history.  ALLERGIES: Allergies  Allergen Reactions   Capsaicin Swelling    FAMILY HISTORY: Family History  Problem Relation Age of Onset   Arthritis/Rheumatoid Mother    Gout Mother    Arthritis Mother    Parkinson's disease Father    Diabetes Father  Bipolar disorder Father    Gallstones Sister    Hodgkin's lymphoma Sister    ADD / ADHD Son    ODD Son     SOCIAL HISTORY: Social History   Tobacco Use   Smoking status: Every Day    Packs/day: 0.25    Types: Cigarettes   Smokeless tobacco: Never  Vaping Use   Vaping Use: Every day   Substances: Nicotine  Substance Use Topics   Alcohol use: No    Comment: occ   Drug use: No   Social History   Social History Narrative   Are you right handed or left handed Right    Are you currently employed ? yes   What is your current occupation?[employer]   Do you live at home  alone?no   Who lives with you? 5 kids and wife   What type of home do you live in: 1 story or 2 story? one    Caffeine 3 cups daily    Objective:  Vital Signs:  BP (!) 144/83   Pulse (!) 58   Ht 5\' 10"  (1.778 m)   Wt 216 lb (98 kg)   SpO2 99%   BMI 30.99 kg/m   General: General appearance: Awake and alert. No distress. Cooperative with exam.  Skin: No obvious rash or jaundice. HEENT: Atraumatic. Anicteric. Lungs: Non-labored breathing on room air  Extremities: No edema. No obvious deformity.  Musculoskeletal: No obvious joint swelling. Psych: Affect appropriate.  Neurological: Mental Status: Alert. Speech fluent. No pseudobulbar affect Cranial Nerves: CNII: No RAPD. Visual fields intact. CNIII, IV, VI: PERRL. No nystagmus. EOMI. CN V: Facial sensation intact bilaterally to fine touch. Masseter clench strong. CN VII: Facial muscles symmetric and strong. No ptosis at rest. CN VIII: Hears finger rub well bilaterally. CN IX: No hypophonia. CN X: Palate elevates symmetrically. CN XI: Full strength shoulder shrug bilaterally. CN XII: Tongue protrusion full and midline. No atrophy or fasciculations. No significant dysarthria Motor: Tone: paratonia, perhaps more increased in lower extremities. No fasciculations in extremities. No atrophy. No grip or percussive myotonia.  Individual muscle group testing (MRC grade out of 5): Give way, hesitation; full strength with best testing  Movement     Neck flexion 5    Neck extension 5     Right Left   Shoulder abduction 5 5   Shoulder adduction 5 5   Shoulder ext rotation 5 5   Shoulder int rotation 5 5   Elbow flexion 5 5   Elbow extension 5 5   Wrist extension 5 5   Wrist flexion 5 5   Finger abduction - FDI 5 5   Finger abduction - ADM 5 5   Finger extension 5 5   Finger distal flexion - 2/3 5 5    Finger distal flexion - 4/5 5 5    Thumb flexion - FPL 5 5   Thumb abduction - APB 5 5    Hip flexion 5 5   Hip  extension 5 5   Hip adduction 5 5   Hip abduction 5 5   Knee extension 5 5   Knee flexion 5 5   Dorsiflexion 5 5   Plantarflexion 5 5   Inversion 5 5   Eversion 5 5   Great toe extension 5 5   Great toe flexion 5 5     Reflexes:  Right Left   Bicep 2+ 2+   Tricep 2+ 2+   BrRad 2+ 2+  Knee 2+ 2+   Ankle 2+ 2+ ?1-2 beats of clonus in right ankle   Pathological Reflexes: Babinski: flexor response bilaterally Hoffman: present bilaterally Troemner: present on left, absent on right Sensation: Pinprick: Absent in both hands. Diminished in entire left foot compared to right foot which is intact. Vibration: Intact in all extremities Proprioception: Intact in bilateral lower extremities Tinel's test positive at fibular head (peroneal nerve) Coordination: Intact finger-to- nose-finger bilaterally. Romberg negative. Gait: Able to rise from chair with arms crossed unassisted with difficulty. Limp in left leg when walking. Able to walk on toes and heels with same limp.  Labs and Imaging review: Internal labs: No results found for: "HGBA1C" No results found for: "VITAMINB12" Lab Results  Component Value Date   TSH 0.740 05/12/2022   Lab Results  Component Value Date   ESRSEDRATE 2 05/12/2022    Imaging: MRI brain wo contrast (06/07/22): FINDINGS: Brain:   Cerebral volume is normal.   No cortical encephalomalacia is identified. No significant cerebral white matter disease.   There is no acute infarct.   No evidence of an intracranial mass.   No chronic intracranial blood products.   No extra-axial fluid collection.   No midline shift.   Vascular: Maintained flow voids within the proximal large arterial vessels.   Skull and upper cervical spine: No focal suspicious marrow lesion.   Sinuses/Orbits: No mass or acute finding within the imaged orbits. No significant paranasal sinus disease.   IMPRESSION: Unremarkable non-contrast MRI appearance of the brain. No  evidence of acute intracranial abnormality.  MRI cervical spine wo contrast (06/07/22): FINDINGS: Intermittently motion degraded exam. Most notably, there is mild-to-moderate motion degradation of the axial T2 TSE sequence, and moderate motion degradation of the axial T2 GRE sequence.   Alignment: Slight C3-C4 and C4-C5 grade 1 retrolisthesis.   Vertebrae: Vertebral body height is maintained. No significant marrow edema or focal suspicious osseous lesion.   Cord: Within the limitations of motion degradation, no signal abnormality is identified within the cervical spinal cord.   Posterior Fossa, vertebral arteries, paraspinal tissues: Posterior fossa better assessed on same-day brain MRI. Flow voids preserved within the imaged cervical vertebral arteries. No paraspinal mass or collection.   Disc levels:   Minimal multilevel disc degeneration.   C2-C3: No significant disc herniation or spinal canal stenosis. Uncovertebral hypertrophy on the right resulting in mild right neural foraminal narrowing.   C3-C4: Slight grade 1 retrolisthesis. No significant disc herniation or spinal canal stenosis. Uncovertebral hypertrophy on the right with resultant mild/moderate right neural foraminal narrowing.   C4-C5: Slight grade 1 retrolisthesis. No significant disc herniation or spinal canal stenosis. Mild uncovertebral hypertrophy on the right resulting in mild right neural foraminal narrowing.   C5-C6: Mild facet arthrosis. No significant disc herniation or stenosis.   C6-C7: No significant disc herniation or stenosis.   C7-T1: No significant disc herniation or stenosis.   IMPRESSION: Intermittently motion degraded examination.   Within the limitations of motion degradation, no signal abnormality is identified within the cervical spinal cord.   Cervical spondylosis, as described. No significant spinal canal stenosis. Uncovertebral hypertrophy results in right-sided  foraminal narrowing at C2-C3 (mild), at C3-C4 (mild/moderate) and at C4-5 (mild).   Slight grade 1 retrolisthesis at C3-C4 and C4-C5.  MRI thoracic spine wo contrast (06/07/22): FINDINGS: The T1 level is excluded from the field of view. However, this level is included in the field of view on the same day cervical spine MRI.   Mild intermittent motion  degradation.   Alignment: No significant spondylolisthesis.   Vertebrae: No lumbar vertebral compression fracture. Small multilevel Schmorl nodes. No significant marrow edema or focal suspicious osseous lesion.   Cord: No signal abnormality is identified within the spinal cord at the thoracic levels   Paraspinal and other soft tissues: No abnormality identified within included portions of the thorax or upper abdomen/retroperitoneum. No paraspinal mass or collection.   Disc levels:   No more than mild disc degeneration within the thoracic spine.   At T8-T9, there is a very shallow broad-based central disc protrusion without significant spinal canal stenosis.   At T10-T11, there is a small right center disc protrusion which mildly effaces the ventral thecal sac (without spinal cord mass effect).   No significant disc herniation or spinal canal stenosis at the remaining levels. No significant foraminal stenosis within the thoracic spine.   Mild facet arthrosis at T11-T12 and T12-L1.   IMPRESSION: Mildly motion degraded exam.   No signal abnormality identified within the spinal cord at the thoracic levels.   Mild thoracic spondylosis, as outlined. Most notably at T10-T11, a small right center disc protrusion mildly effaces the ventral thecal sac (without spinal cord mass effect).  MRI lumbar spine wo contrast (05/10/22): FINDINGS: Segmentation:  Standard.   Alignment:  Mild levocurvature.  No listhesis.   Vertebrae:  No acute fracture or suspicious osseous lesion.   Conus medullaris and cauda equina: Conus extends  to the L1-L2 level. Conus and cauda equina appear normal.   Paraspinal and other soft tissues: Negative.   Disc levels:   T12-L1: Seen only on the sagittal images. No significant disc bulge, spinal canal stenosis, or neural foraminal narrowing.   L1-L2: No significant disc bulge. No spinal canal stenosis or neural foraminal narrowing.   L2-L3: No significant disc bulge. No spinal canal stenosis or neural foraminal narrowing.   L3-L4: No significant disc bulge. No spinal canal stenosis or neural foraminal narrowing.   L4-L5: No significant disc bulge. No spinal canal stenosis or neural foraminal narrowing.   L5-S1: No significant disc bulge. No spinal canal stenosis or neural foraminal narrowing.   IMPRESSION: No significant degenerative changes. No spinal canal stenosis or neural foraminal narrowing.  Left leg DVT US (08/19/22): IMPRESSION: No evidence of left lower extremity deep venous thrombosis.   Assessment/Plan:  Jake Walsh is a 36 y.o. male who presents for evaluation of pain and weakness of left lower extremity, chronic numbness of hands, and weakness of left hand. He has a relevant medical history of tobacco use. His neurological examination is pertinent for diminished sensation in hands and entire left foot, tinsel's positive at left fibular head, but intact strength and reflexes. He walks cautiously with a limp. Available diagnostic data is significant for MRI of entire neuro axis without contrast was essentially normal. DVT US of LLE showed no evidence of DVT. The etiology of patient's symptoms is currently unclear. There are signs of increased tone (vs incomplete relaxation) that could still suggest a central nervous system etiology such as demyelinating disease. A peroneal neuropathy at the fibular head is also possible, but a lack of weakness is unusual. Finally, symptoms could also be compatible with a functional neurologic disorder. I will work up further as  below.  PLAN: -Blood work: B12, vit E, copper -EMG: LUE and LLE -May consider MRI studies with contrast if other work up is unrevealing -Continue gabapentin for pain control   -Return to clinic in 3 months  The impression above  as well as the plan as outlined below were extensively discussed with the patient who voiced understanding. All questions were answered to their satisfaction.  The patient was counseled on pertinent fall precautions per the printed material provided today, and as noted under the "Patient Instructions" section below.  When available, results of the above investigations and possible further recommendations will be communicated to the patient via telephone/MyChart. Patient to call office if not contacted after expected testing turnaround time.   Total time spent reviewing records, interview, history/exam, documentation, and coordination of care on day of encounter:  65 min   Thank you for allowing me to participate in patient's care.  If I can answer any additional questions, I would be pleased to do so.  Jacquelyne BalintJeremy Sierra Bissonette, MD   CC: Raliegh IpGottschalk, Ashly M, DO [address] [place] [id number]  CC: Referring provider: Lisbeth RenshawNundkumar, Neelesh, MD [address] [place] [id number]

## 2022-08-19 ENCOUNTER — Ambulatory Visit (HOSPITAL_COMMUNITY)
Admission: RE | Admit: 2022-08-19 | Discharge: 2022-08-19 | Disposition: A | Discharge status: Home / Self Care | Payer: BC Managed Care – PPO | Source: Ambulatory Visit | Attending: Family Medicine | Admitting: Family Medicine

## 2022-08-19 DIAGNOSIS — M7989 Other specified soft tissue disorders: Secondary | ICD-10-CM | POA: Insufficient documentation

## 2022-08-19 DIAGNOSIS — R600 Localized edema: Secondary | ICD-10-CM | POA: Diagnosis not present

## 2022-08-20 ENCOUNTER — Encounter: Payer: Self-pay | Admitting: Neurology

## 2022-08-20 ENCOUNTER — Other Ambulatory Visit (INDEPENDENT_AMBULATORY_CARE_PROVIDER_SITE_OTHER): Payer: BC Managed Care – PPO

## 2022-08-20 ENCOUNTER — Ambulatory Visit: Payer: BC Managed Care – PPO | Admitting: Neurology

## 2022-08-20 VITALS — BP 144/83 | HR 58 | Ht 70.0 in | Wt 216.0 lb

## 2022-08-20 DIAGNOSIS — R29898 Other symptoms and signs involving the musculoskeletal system: Secondary | ICD-10-CM

## 2022-08-20 DIAGNOSIS — W19XXXS Unspecified fall, sequela: Secondary | ICD-10-CM

## 2022-08-20 DIAGNOSIS — M79605 Pain in left leg: Secondary | ICD-10-CM

## 2022-08-20 DIAGNOSIS — R200 Anesthesia of skin: Secondary | ICD-10-CM

## 2022-08-20 DIAGNOSIS — R2681 Unsteadiness on feet: Secondary | ICD-10-CM

## 2022-08-20 LAB — VITAMIN B12: Vitamin B-12: 648 pg/mL (ref 211–911)

## 2022-08-20 NOTE — Patient Instructions (Signed)
I saw you for left leg pain and weakness.  I would like to investigate further: -Blood work today -Muscle and nerve test called EMG (see below for more info). Schedule this before leaving today.  I will be in touch when I have the results of these tests to discuss next steps.  Continue gabapentin for pain control.  I want to see you back in clinic in 3 months. Please let me know if you have any questions or concerns in the meantime.   The physicians and staff at [facility] are committed to providing excellent care. You may receive a survey requesting feedback about your experience at our office. We strive to receive "very good" responses to the survey questions. If you feel that your experience would prevent you from giving the office a "very good " response, please contact our office to try to remedy the situation. We may be reached at [phone]. Thank you for taking the time out of your busy day to complete the survey.  Kai Levins, MD [facility]  ELECTROMYOGRAM AND NERVE CONDUCTION STUDIES (EMG/NCS) INSTRUCTIONS  How to Prepare The neurologist conducting the EMG will need to know if you have certain medical conditions. Tell the neurologist and other EMG lab personnel if you: Have a pacemaker or any other electrical medical device Take blood-thinning medications Have hemophilia, a blood-clotting disorder that causes prolonged bleeding Bathing Take a shower or bath shortly before your exam in order to remove oils from your skin. Don't apply lotions or creams before the exam.  What to Expect You'll likely be asked to change into a hospital gown for the procedure and lie down on an examination table. The following explanations can help you understand what will happen during the exam.  Electrodes. The neurologist or a technician places surface electrodes at various locations on your skin depending on where you're experiencing symptoms. Or the neurologist may insert needle  electrodes at different sites depending on your symptoms.  Sensations. The electrodes will at times transmit a tiny electrical current that you may feel as a twinge or spasm. The needle electrode may cause discomfort or pain that usually ends shortly after the needle is removed. If you are concerned about discomfort or pain, you may want to talk to the neurologist about taking a short break during the exam.  Instructions. During the needle EMG, the neurologist will assess whether there is any spontaneous electrical activity when the muscle is at rest - activity that isn't present in healthy muscle tissue - and the degree of activity when you slightly contract the muscle.  He or she will give you instructions on resting and contracting a muscle at appropriate times. Depending on what muscles and nerves the neurologist is examining, he or she may ask you to change positions during the exam.  After your EMG You may experience some temporary, minor bruising where the needle electrode was inserted into your muscle. This bruising should fade within several days. If it persists, contact your primary care doctor.   Preventing Falls at [facility] are common, often dreaded events in the lives of older people. Aside from the obvious injuries and even death that may result, fall can cause wide-ranging consequences including loss of independence, mental decline, decreased activity and mobility. Younger people are also at risk of falling, especially those with chronic illnesses and fatigue.  Ways to reduce risk for falling Examine diet and medications. Warm foods and alcohol dilate blood vessels, which can lead to dizziness  when standing. Sleep aids, antidepressants and pain medications can also increase the likelihood of a fall.  Get a vision exam. Poor vision, cataracts and glaucoma increase the chances of falling.  Check foot gear. Shoes should fit snugly and have a sturdy, nonskid sole and a broad, low  heel  Participate in a physician-approved exercise program to build and maintain muscle strength and improve balance and coordination. Programs that use ankle weights or stretch bands are excellent for muscle-strengthening. Water aerobics programs and low-impact Tai Chi programs have also been shown to improve balance and coordination.  Increase vitamin D intake. Vitamin D improves muscle strength and increases the amount of calcium the body is able to absorb and deposit in bones.  How to prevent falls from common hazards Floors - Remove all loose wires, cords, and throw rugs. Minimize clutter. Make sure rugs are anchored and smooth. Keep furniture in its usual place.  Chairs -- Use chairs with straight backs, armrests and firm seats. Add firm cushions to existing pieces to add height.  Bathroom - Install grab bars and non-skid tape in the tub or shower. Use a bathtub transfer bench or a shower chair with a back support Use an elevated toilet seat and/or safety rails to assist standing from a low surface. Do not use towel racks or bathroom tissue holders to help you stand.  Lighting - Make sure halls, stairways, and entrances are well-lit. Install a night light in your bathroom or hallway. Make sure there is a light switch at the top and bottom of the staircase. Turn lights on if you get up in the middle of the night. Make sure lamps or light switches are within reach of the bed if you have to get up during the night.  Kitchen - Install non-skid rubber mats near the sink and stove. Clean spills immediately. Store frequently used utensils, pots, pans between waist and eye level. This helps prevent reaching and bending. Sit when getting things out of lower cupboards.  Living room/ Bedrooms - Place furniture with wide spaces in between, giving enough room to move around. Establish a route through the living room that gives you something to hold onto as you walk.  Stairs - Make sure treads, rails, and  rugs are secure. Install a rail on both sides of the stairs. If stairs are a threat, it might be helpful to arrange most of your activities on the lower level to reduce the number of times you must climb the stairs.  Entrances and doorways - Install metal handles on the walls adjacent to the doorknobs of all doors to make it more secure as you travel through the doorway.  Tips for maintaining balance Keep at least one hand free at all times. Try using a backpack or fanny pack to hold things rather than carrying them in your hands. Never carry objects in both hands when walking as this interferes with keeping your balance.  Attempt to swing both arms from front to back while walking. This might require a conscious effort if Parkinson's disease has diminished your movement. It will, however, help you to maintain balance and posture, and reduce fatigue.  Consciously lift your feet off of the ground when walking. Shuffling and dragging of the feet is a common culprit in losing your balance.  When trying to navigate turns, use a "U" technique of facing forward and making a wide turn, rather than pivoting sharply.  Try to stand with your feet shoulder-length apart. When your feet  are close together for any length of time, you increase your risk of losing your balance and falling.  Do one thing at a time. Don't try to walk and accomplish another task, such as reading or looking around. The decrease in your automatic reflexes complicates motor function, so the less distraction, the better.  Do not wear rubber or gripping soled shoes, they might "catch" on the floor and cause tripping.  Move slowly when changing positions. Use deliberate, concentrated movements and, if needed, use a grab bar or walking aid. Count 15 seconds between each movement. For example, when rising from a seated position, wait 15 seconds after standing to begin walking.  If balance is a continuous problem, you might want to  consider a walking aid such as a cane, walking stick, or walker. Once you've mastered walking with help, you might be ready to try it on your own again.

## 2022-08-25 LAB — VITAMIN E
Gamma-Tocopherol (Vit E): 1.1 mg/L (ref <=4.3)
Vitamin E (Alpha Tocopherol): 10.1 mg/L (ref 5.7–19.9)

## 2022-08-25 LAB — COPPER, SERUM: Copper: 83 ug/dL (ref 70–175)

## 2022-09-13 ENCOUNTER — Encounter: Payer: Self-pay | Admitting: Neurology

## 2022-09-13 NOTE — Progress Notes (Unsigned)
I saw KAYLON HITZ in neurology clinic on 09/16/22 in follow up for leg weakness.  HPI: SKYLEN SPIERING is a 36 y.o. year old male with a history of tobacco use who we last saw on 08/20/22.  To briefly review: Patient first noticed symptoms with pain in left leg about 1 year ago (~08/2021). He first noticed that electric pain behind his knee. It would shot up into his low back and into the arch of the left foot. The pain would come and go. It has been the same since onset. About 5 months ago the left leg would start to buckle. He was working and felt his left knee was giving out. He had to grab the table to keep from falling. It was a sudden weakness. He tried stretching and moving his leg. It took about 15 minutes the first time to get strength back to normal. The next day, a similar episode occurred and it took 30-45 minutes to get strength back. It continued to progress until it became constant about 2-3 weeks later. The weakness is mostly behind the knee. He walks with his leg stiff because he is afraid he is going to fall. He has fallen about 20 times in the last 4 months because of the weakness. Other than the radiating pain up, he denies significant pain in his back.   He denies symptoms in his right leg or right arm. He does have fingers that lock up in the left hand (digits 3-5). He is dropping things in his left hand and it feels weak. He states he has very little sensation in his hands since a teenager.   Patient was seen by [facility] previously (exam by [facility] was 5/5). MRI brain and spinal cord showed no significant abnormalities to explain symptoms. Patient was then referred to neurology for further evaluation.   Patient is currently on gabapentin 300 mg about every 4 hours (3 times a day). This does helps with the pain.    He went to PT as well. He did not want to put weight on that leg during therapy though, so he did not benefit well.   Patient's father has  parkinson's, so he was worried something like that was happening to him.   EtOH use: very rare (1-2 shot every 3 months) Restrictive diet: No  Most recent Assessment and Plan (08/20/22): The etiology of patient's symptoms is currently unclear. There are signs of increased tone (vs incomplete relaxation) that could still suggest a central nervous system etiology such as demyelinating disease. A peroneal neuropathy at the fibular head is also possible, but a lack of weakness is unusual. Finally, symptoms could also be compatible with a functional neurologic disorder. I will work up further as below.   PLAN: -Blood work: B12, vit E, copper -EMG: LUE and LLE -May consider MRI studies with contrast if other work up is unrevealing -Continue gabapentin for pain control  Since their last visit: B12, copper, and vit E were all normal. Patient's EMG is scheduled for 10/25/21. Patient is now having symptoms in his right leg. His right knee is buckling and having pain behind right and left knee. He is having bad cramps bilaterally. Patient is taking gabapentin (300 mg in morning and midday) then 600 mg at night.  He has fallen multiple times, including in the shower yesterday.   MEDICATIONS:  Outpatient Encounter Medications as of 09/16/2022  Medication Sig   gabapentin (NEURONTIN) 600 MG tablet Take  0.5-1 tablets (300-600 mg total) by mouth 3 (three) times daily as needed (pain).   ibuprofen (ADVIL) 600 MG tablet Take 1 tablet (600 mg total) by mouth every 8 (eight) hours as needed.   omeprazole (PRILOSEC) 20 MG capsule Take 1 capsule (20 mg total) by mouth daily.   No facility-administered encounter medications on file as of 09/16/2022.    PAST MEDICAL HISTORY: Past Medical History:  Diagnosis Date   Anxiety    Coarse tremors     PAST SURGICAL HISTORY: No past surgical history on file.  ALLERGIES: Allergies  Allergen Reactions   Capsaicin Swelling    FAMILY HISTORY: Family History   Problem Relation Age of Onset   Arthritis/Rheumatoid Mother    Gout Mother    Arthritis Mother    Parkinson's disease Father    Diabetes Father    Bipolar disorder Father    Gallstones Sister    Hodgkin's lymphoma Sister    ADD / ADHD Son    ODD Son     SOCIAL HISTORY: Social History   Tobacco Use   Smoking status: Every Day    Packs/day: 0.25    Types: Cigarettes   Smokeless tobacco: Never  Vaping Use   Vaping Use: Every day   Substances: Nicotine  Substance Use Topics   Alcohol use: No    Comment: occ   Drug use: No   Social History   Social History Narrative   Are you right handed or left handed Right    Are you currently employed ? yes   What is your current occupation?[employer]   Do you live at home alone?no   Who lives with you? 5 kids and wife   What type of home do you live in: 1 story or 2 story? one    Caffeine 3 cups daily    Objective:  Vital Signs:  BP 134/86   Pulse 80   Ht 5' 11" (1.803 m)   Wt 210 lb 12.8 oz (95.6 kg)   SpO2 100%   BMI 29.40 kg/m   General: General appearance: Awake and alert. No distress. Cooperative with exam. Skin: No rash or jaundice. HEENT: Atraumatic. Anicteric. Lungs: Non-labored breathing on room air  Extremities: No edema. No obvious deformity.  Musculoskeletal: No obvious joint swelling.  Neurological: Mental Status: Alert. Speech fluent. No pseudobulbar affect Cranial Nerves: CNII: No RAPD. Visual fields intact. CNIII, IV, VI: PERRL. No nystagmus. EOMI. CN V: Facial sensation intact bilaterally to fine touch. CN VII: Facial muscles symmetric and strong. No ptosis at rest CN VIII: Hears finger rub well bilaterally. CN IX: No hypophonia. CN X: Palate elevates symmetrically. CN XI: Full strength shoulder shrug bilaterally. CN XII: Tongue protrusion full and midline. No atrophy or fasciculations. No significant dysarthria Motor: Tone is normal. 5/5 throughout with some give way weakness in  bilateral lower extremities with shakiness in hands and legs. Reflexes:  Right Left  Bicep 2+ 2+  Tricep 2+ 2+  BrRad 2+ 2+  Knee 3+ 3+  Ankle 2+ 2+   Cross adductors with bilateral patellar reflex  Pathological Reflexes: Babinski: mute response bilaterally Hoffman: positive on left, negative on right Troemner: absent bilaterally Sensation: Pinprick: absent in bilateral palms, otherwise intact Coordination: Romberg negative. Gait: Able to rise from chair with arms crossed unassisted but with rocking and some difficulty. Left leg is kept locked (patient says it feels like it will buckle), some abnormal limping, ?atasia abasia   Lab and Test Review: New  results: 08/20/22: B12: 648 Vit E: 10.1 (wnl) Copper: 83 (wnl)  Previously reviewed results: TSH (05/12/22): 0.740 ESR (05/12/22): 2  Imaging: MRI brain wo contrast (06/07/22): FINDINGS: Brain:   Cerebral volume is normal.   No cortical encephalomalacia is identified. No significant cerebral white matter disease.   There is no acute infarct.   No evidence of an intracranial mass.   No chronic intracranial blood products.   No extra-axial fluid collection.   No midline shift.   Vascular: Maintained flow voids within the proximal large arterial vessels.   Skull and upper cervical spine: No focal suspicious marrow lesion.   Sinuses/Orbits: No mass or acute finding within the imaged orbits. No significant paranasal sinus disease.   IMPRESSION: Unremarkable non-contrast MRI appearance of the brain. No evidence of acute intracranial abnormality.   MRI cervical spine wo contrast (06/07/22): FINDINGS: Intermittently motion degraded exam. Most notably, there is mild-to-moderate motion degradation of the axial T2 TSE sequence, and moderate motion degradation of the axial T2 GRE sequence.   Alignment: Slight C3-C4 and C4-C5 grade 1 retrolisthesis.   Vertebrae: Vertebral body height is maintained. No  significant marrow edema or focal suspicious osseous lesion.   Cord: Within the limitations of motion degradation, no signal abnormality is identified within the cervical spinal cord.   Posterior Fossa, vertebral arteries, paraspinal tissues: Posterior fossa better assessed on same-day brain MRI. Flow voids preserved within the imaged cervical vertebral arteries. No paraspinal mass or collection.   Disc levels:   Minimal multilevel disc degeneration.   C2-C3: No significant disc herniation or spinal canal stenosis. Uncovertebral hypertrophy on the right resulting in mild right neural foraminal narrowing.   C3-C4: Slight grade 1 retrolisthesis. No significant disc herniation or spinal canal stenosis. Uncovertebral hypertrophy on the right with resultant mild/moderate right neural foraminal narrowing.   C4-C5: Slight grade 1 retrolisthesis. No significant disc herniation or spinal canal stenosis. Mild uncovertebral hypertrophy on the right resulting in mild right neural foraminal narrowing.   C5-C6: Mild facet arthrosis. No significant disc herniation or stenosis.   C6-C7: No significant disc herniation or stenosis.   C7-T1: No significant disc herniation or stenosis.   IMPRESSION: Intermittently motion degraded examination.   Within the limitations of motion degradation, no signal abnormality is identified within the cervical spinal cord.   Cervical spondylosis, as described. No significant spinal canal stenosis. Uncovertebral hypertrophy results in right-sided foraminal narrowing at C2-C3 (mild), at C3-C4 (mild/moderate) and at C4-5 (mild).   Slight grade 1 retrolisthesis at C3-C4 and C4-C5.   MRI thoracic spine wo contrast (06/07/22): FINDINGS: The T1 level is excluded from the field of view. However, this level is included in the field of view on the same day cervical spine MRI.   Mild intermittent motion degradation.   Alignment: No significant  spondylolisthesis.   Vertebrae: No lumbar vertebral compression fracture. Small multilevel Schmorl nodes. No significant marrow edema or focal suspicious osseous lesion.   Cord: No signal abnormality is identified within the spinal cord at the thoracic levels   Paraspinal and other soft tissues: No abnormality identified within included portions of the thorax or upper abdomen/retroperitoneum. No paraspinal mass or collection.   Disc levels:   No more than mild disc degeneration within the thoracic spine.   At T8-T9, there is a very shallow broad-based central disc protrusion without significant spinal canal stenosis.   At T10-T11, there is a small right center disc protrusion which mildly effaces the ventral thecal sac (without spinal cord mass  effect).   No significant disc herniation or spinal canal stenosis at the remaining levels. No significant foraminal stenosis within the thoracic spine.   Mild facet arthrosis at T11-T12 and T12-L1.   IMPRESSION: Mildly motion degraded exam.   No signal abnormality identified within the spinal cord at the thoracic levels.   Mild thoracic spondylosis, as outlined. Most notably at T10-T11, a small right center disc protrusion mildly effaces the ventral thecal sac (without spinal cord mass effect).   MRI lumbar spine wo contrast (05/10/22): FINDINGS: Segmentation:  Standard.   Alignment:  Mild levocurvature.  No listhesis.   Vertebrae:  No acute fracture or suspicious osseous lesion.   Conus medullaris and cauda equina: Conus extends to the L1-L2 level. Conus and cauda equina appear normal.   Paraspinal and other soft tissues: Negative.   Disc levels:   T12-L1: Seen only on the sagittal images. No significant disc bulge, spinal canal stenosis, or neural foraminal narrowing.   L1-L2: No significant disc bulge. No spinal canal stenosis or neural foraminal narrowing.   L2-L3: No significant disc bulge. No spinal canal  stenosis or neural foraminal narrowing.   L3-L4: No significant disc bulge. No spinal canal stenosis or neural foraminal narrowing.   L4-L5: No significant disc bulge. No spinal canal stenosis or neural foraminal narrowing.   L5-S1: No significant disc bulge. No spinal canal stenosis or neural foraminal narrowing.   IMPRESSION: No significant degenerative changes. No spinal canal stenosis or neural foraminal narrowing.   Left leg DVT US (08/19/22): IMPRESSION: No evidence of left lower extremity deep venous thrombosis.  ASSESSMENT: This is [name], a 36 y.o. male with leg weakness that started in left leg about 1 year ago. Recently patient felt it now included right leg. Exam with give way weakness and possible atasia abasia gait. There is some hyperreflexia, but MRI of brain, C/T spine wo contrast were unremarkable. I favor a non-neurologic etiology, perhaps anxiety, as cause of symptoms. This was discussed with patient today. I will move up EMG to next week.   Plan: -Blood work: ionized Ca, PTH, vit D -EMG - was at 10/25/21; will move up to 09/20/22 at 10 am -Could consider contrasted MRIs  Return to clinic: keep current follow up on 11/24/21  Total time spent reviewing records, interview, history/exam, documentation, and coordination of care on day of encounter:  30 min  Kai Levins, MD

## 2022-09-16 ENCOUNTER — Other Ambulatory Visit (INDEPENDENT_AMBULATORY_CARE_PROVIDER_SITE_OTHER): Payer: BC Managed Care – PPO

## 2022-09-16 ENCOUNTER — Ambulatory Visit: Payer: BC Managed Care – PPO | Admitting: Neurology

## 2022-09-16 ENCOUNTER — Encounter: Payer: Self-pay | Admitting: Neurology

## 2022-09-16 VITALS — BP 134/86 | HR 80 | Ht 71.0 in | Wt 210.8 lb

## 2022-09-16 DIAGNOSIS — R29898 Other symptoms and signs involving the musculoskeletal system: Secondary | ICD-10-CM

## 2022-09-16 DIAGNOSIS — R2681 Unsteadiness on feet: Secondary | ICD-10-CM

## 2022-09-16 DIAGNOSIS — R200 Anesthesia of skin: Secondary | ICD-10-CM | POA: Diagnosis not present

## 2022-09-16 DIAGNOSIS — W19XXXS Unspecified fall, sequela: Secondary | ICD-10-CM

## 2022-09-16 DIAGNOSIS — M79605 Pain in left leg: Secondary | ICD-10-CM

## 2022-09-16 DIAGNOSIS — M79604 Pain in right leg: Secondary | ICD-10-CM

## 2022-09-16 LAB — VITAMIN D 25 HYDROXY (VIT D DEFICIENCY, FRACTURES): VITD: 16.58 ng/mL — ABNORMAL LOW (ref 30.00–100.00)

## 2022-09-16 NOTE — Patient Instructions (Signed)
We will move up your EMG to Monday, 09/20/22 at 10 am.  We will get more lab work today.  - Recommend the following measures that may provide some symptomatic benefit for muscle twitching and/or cramps: - Adequate oral clear fluid intake to maintain optimal hydration (about 2.5 liters, or around 8-10 glasses per day) Avoidance of caffeine Trial of DIET tonic water: About 1 glass, up to 6 times daily Magnesium oxide up to 400 mg by mouth twice daily, as needed (over the counter) Gentle muscle stretching routine, especially before bedtime   We will discuss next steps after EMG and blood work.  The physicians and staff at [facility] are committed to providing excellent care. You may receive a survey requesting feedback about your experience at our office. We strive to receive "very good" responses to the survey questions. If you feel that your experience would prevent you from giving the office a "very good " response, please contact our office to try to remedy the situation. We may be reached at [phone]. Thank you for taking the time out of your busy day to complete the survey.  Jacquelyne Balint, MD [facility]  ELECTROMYOGRAM AND NERVE CONDUCTION STUDIES (EMG/NCS) INSTRUCTIONS  How to Prepare The neurologist conducting the EMG will need to know if you have certain medical conditions. Tell the neurologist and other EMG lab personnel if you: Have a pacemaker or any other electrical medical device Take blood-thinning medications Have hemophilia, a blood-clotting disorder that causes prolonged bleeding Bathing Take a shower or bath shortly before your exam in order to remove oils from your skin. Don't apply lotions or creams before the exam.  What to Expect You'll likely be asked to change into a hospital gown for the procedure and lie down on an examination table. The following explanations can help you understand what will happen during the exam.  Electrodes. The neurologist  or a technician places surface electrodes at various locations on your skin depending on where you're experiencing symptoms. Or the neurologist may insert needle electrodes at different sites depending on your symptoms.  Sensations. The electrodes will at times transmit a tiny electrical current that you may feel as a twinge or spasm. The needle electrode may cause discomfort or pain that usually ends shortly after the needle is removed. If you are concerned about discomfort or pain, you may want to talk to the neurologist about taking a short break during the exam.  Instructions. During the needle EMG, the neurologist will assess whether there is any spontaneous electrical activity when the muscle is at rest - activity that isn't present in healthy muscle tissue - and the degree of activity when you slightly contract the muscle.  He or she will give you instructions on resting and contracting a muscle at appropriate times. Depending on what muscles and nerves the neurologist is examining, he or she may ask you to change positions during the exam.  After your EMG You may experience some temporary, minor bruising where the needle electrode was inserted into your muscle. This bruising should fade within several days. If it persists, contact your primary care doctor.

## 2022-09-17 LAB — CALCIUM, IONIZED: Calcium, Ion: 5.1 mg/dL (ref 4.7–5.5)

## 2022-09-17 LAB — PARATHYROID HORMONE, INTACT (NO CA): PTH: 56 pg/mL (ref 16–77)

## 2022-09-20 ENCOUNTER — Telehealth: Payer: Self-pay | Admitting: Neurology

## 2022-09-20 ENCOUNTER — Ambulatory Visit (INDEPENDENT_AMBULATORY_CARE_PROVIDER_SITE_OTHER): Payer: BC Managed Care – PPO | Admitting: Neurology

## 2022-09-20 DIAGNOSIS — R29898 Other symptoms and signs involving the musculoskeletal system: Secondary | ICD-10-CM

## 2022-09-20 DIAGNOSIS — W19XXXS Unspecified fall, sequela: Secondary | ICD-10-CM

## 2022-09-20 DIAGNOSIS — R2681 Unsteadiness on feet: Secondary | ICD-10-CM

## 2022-09-20 DIAGNOSIS — M79605 Pain in left leg: Secondary | ICD-10-CM

## 2022-09-20 DIAGNOSIS — R200 Anesthesia of skin: Secondary | ICD-10-CM

## 2022-09-20 NOTE — Procedures (Signed)
[facility]  [address] [place], [address]  [place], [place] [address] Tel: [phone] Fax: [phone] Test Date:  09/20/2022  Patient: Jake Walsh DOB: [birth year] Physician: Jacquelyne Balint, MD  Sex: Male Height: 5\' 11"  Ref Phys: , MD  ID#: Jacquelyne Balint   Technician:    History: This is a 36 year old male with leg weakness and pain.  NCV & EMG Findings: Extensive electrodiagnostic evaluation of the left upper and lower limbs show: Left sural, superficial peroneal/fibular, median, ulnar, and radial sensory responses are within normal limits. Left peroneal/fibular (EDB), tibial (AH), median (APB), and ulnar (ADM) motor responses are within normal limits. Left H reflex latency is within normal limits. There is no evidence of active or chronic motor axon loss changes affecting any of the tested muscles. Motor unit configuration and recruitment pattern is within normal limits.  Impression: This is a normal electrodiagnostic evaluation. Specifically, there is no electrodiagnostic evidence of a large fiber sensorimotor neuropathy, left lumbosacral (L3-S1) or left cervical (C5-C8) motor radiculopathy, and no evidence of a left peroneal/fibular, tibial, median, or ulnar mononeuropathy.    ___________________________ 20, MD    Nerve Conduction Studies Motor Nerve Results    Latency Amplitude F-Lat Segment Distance CV Comment  Site (ms) Norm (mV) Norm (ms)  (cm) (m/s) Norm   Left Fibular (EDB) Motor  Ankle 3.9  < 5.5 12.0  > 3.0        Bel fib head 10.0 - 11.6 -  Bel fib head-Ankle 31 51  > 40   Pop fossa 11.8 - 11.3 -  Pop fossa-Bel fib head 9 50 -   Left Median (APB) Motor  Wrist 2.6  < 3.9 8.9  > 6.0        Elbow 7.1 - 8.8 -  Elbow-Wrist 27 60  > 50   Left Tibial (AH) Motor  Ankle 5.0  < 6.0 12.4  > 8.0        Knee 13.3 - 9.2 -  Knee-Ankle 40 48  > 40   Left Ulnar (ADM) Motor  Wrist 2.5  < 3.1 11.0  > 7.0        Bel elbow 5.8 - 9.6 -  Bel  elbow-Wrist 21 64  > 50   Ab elbow 7.7 - 9.4 -  Ab elbow-Bel elbow 10 53 -    Sensory Sites    Neg Peak Lat Amplitude (O-P) Segment Distance Velocity Comment  Site (ms) Norm (V) Norm  (cm) (ms)   Left Median Sensory  Wrist-Dig II 3.2  < 3.4 49  > 20 Wrist-Dig II 13    Left Radial Sensory  Forearm-Wrist 2.2  < 2.7 39  > 18 Forearm-Wrist 10    Left Superficial Fibular Sensory  14 cm-Ankle 3.0  < 4.5 16  > 5 14 cm-Ankle 14    Left Sural Sensory  Calf-Lat mall 4.4  < 4.5 14  > 5 Calf-Lat mall 14    Left Ulnar Sensory  Wrist-Dig V 3.0  < 3.1 34  > 12 Wrist-Dig V 11     H-Reflex Results    M-Lat H Lat H Neg Amp H-M Lat  Site (ms) (ms) Norm (mV) (ms)  Left Tibial H-Reflex  Pop fossa 3.9 33.5  < 35.0 4.2 29.6   Electromyography   Side Muscle Ins.Act Fibs Fasc Recrt Amp Dur Poly Activation Comment  Left Tib ant Nml Nml Nml Nml Nml Nml Nml Nml N/A  Left Gastroc MH Nml  Nml Nml Nml Nml Nml Nml Nml N/A  Left FDL Nml Nml Nml Nml Nml Nml Nml Nml N/A  Left Rectus fem Nml Nml Nml Nml Nml Nml Nml Nml N/A  Left Gluteus med Nml Nml Nml Nml Nml Nml Nml Nml N/A  Left FDI Nml Nml Nml Nml Nml Nml Nml Nml N/A  Left EIP Nml Nml Nml Nml Nml Nml Nml Nml N/A  Left Pronator teres Nml Nml Nml Nml Nml Nml Nml Nml N/A  Left Biceps Nml Nml Nml Nml Nml Nml Nml Nml N/A  Left Triceps Nml Nml Nml Nml Nml Nml Nml Nml N/A  Left Deltoid Nml Nml Nml Nml Nml Nml Nml Nml N/A      Waveforms:  Motor           Sensory             H-Reflex

## 2022-09-20 NOTE — Telephone Encounter (Signed)
Discussed the results of patient's EMG after the procedure today. EMG was normal. There is no clear neurologic explanation for patient's symptoms as his MRI of entire neuroaxis and EMG were both normal. Patient will discuss with PCP about any other potential work up, but as we discussed at our last clinic visit, symptoms may be functional in nature and related to stress/anxiety.  All questions were answered.  Jacquelyne Balint, MD [facility]

## 2022-10-25 ENCOUNTER — Encounter: Payer: BC Managed Care – PPO | Admitting: Neurology

## 2022-11-16 NOTE — Progress Notes (Deleted)
NEUROLOGY FOLLOW UP OFFICE NOTE  Jake Walsh [id number]  Subjective:  Jake Walsh is a 37 y.o. year old male with history of tobacco use and anxiety who we last saw on 09/16/22.  To briefly review: Patient first noticed symptoms with pain in left leg about 1 year ago (~08/2021). He first noticed that electric pain behind his knee. It would shot up into his low back and into the arch of the left foot. The pain would come and go. It has been the same since onset. About 5 months ago the left leg would start to buckle. He was working and felt his left knee was giving out. He had to grab the table to keep from falling. It was a sudden weakness. He tried stretching and moving his leg. It took about 15 minutes the first time to get strength back to normal. The next day, a similar episode occurred and it took 30-45 minutes to get strength back. It continued to progress until it became constant about 2-3 weeks later. The weakness is mostly behind the knee. He walks with his leg stiff because he is afraid he is going to fall. He has fallen about 20 times in the last 4 months because of the weakness. Other than the radiating pain up, he denies significant pain in his back.   He denies symptoms in his right leg or right arm. He does have fingers that lock up in the left hand (digits 3-5). He is dropping things in his left hand and it feels weak. He states he has very little sensation in his hands since a teenager.   Patient was seen by [facility] previously (exam by [facility] was 5/5). MRI brain and spinal cord showed no significant abnormalities to explain symptoms. Patient was then referred to neurology for further evaluation.   Patient is currently on gabapentin 300 mg about every 4 hours (3 times a day). This does helps with the pain.    He went to PT as well. He did not want to put weight on that leg during therapy though, so he did not benefit well.   Patient's father has  parkinson's, so he was worried something like that was happening to him.   EtOH use: very rare (1-2 shot every 3 months) Restrictive diet: No  B12, copper, and vit E were all normal after initial visit on 08/20/22. Patient then had symptoms in his right leg. His right knee is buckling and having pain behind right and left knee. He is having bad cramps bilaterally. Patient was taking gabapentin (300 mg in morning and midday) then 600 mg at night. He has fallen multiple times, including in the shower on 09/15/22.  Most recent Assessment and Plan (09/16/22): This is Jake Walsh, a 37 y.o. male with leg weakness that started in left leg about 1 year ago. Recently patient felt it now included right leg. Exam with give way weakness and possible atasia abasia gait. There is some hyperreflexia, but MRI of brain, C/T spine wo contrast were unremarkable. I favor a non-neurologic etiology, perhaps anxiety, as cause of symptoms. This was discussed with patient today. I will move up EMG to next week.    Plan: -Blood work: ionized Ca, PTH, vit D -EMG - was at 10/25/21; will move up to 09/20/22 at 10 am -Could consider contrasted MRIs  Since their last visit: Labs were significant for low vit D. Supplementation was recommended. ***  EMG on 09/20/22  was normal. We discussed the possibility of this being a functional neurologic deficit and perhaps related to anxiety.  Since EMG, ***  MEDICATIONS:  Outpatient Encounter Medications as of 11/24/2022  Medication Sig   gabapentin (NEURONTIN) 600 MG tablet Take 0.5-1 tablets (300-600 mg total) by mouth 3 (three) times daily as needed (pain).   ibuprofen (ADVIL) 600 MG tablet Take 1 tablet (600 mg total) by mouth every 8 (eight) hours as needed.   omeprazole (PRILOSEC) 20 MG capsule Take 1 capsule (20 mg total) by mouth daily.   No facility-administered encounter medications on file as of 11/24/2022.    PAST MEDICAL HISTORY: Past Medical History:  Diagnosis  Date   Anxiety    Coarse tremors     PAST SURGICAL HISTORY: No past surgical history on file.  ALLERGIES: Allergies  Allergen Reactions   Capsaicin Swelling    FAMILY HISTORY: Family History  Problem Relation Age of Onset   Arthritis/Rheumatoid Mother    Gout Mother    Arthritis Mother    Parkinson's disease Father    Diabetes Father    Bipolar disorder Father    Gallstones Sister    Hodgkin's lymphoma Sister    ADD / ADHD Son    ODD Son     SOCIAL HISTORY: Social History   Tobacco Use   Smoking status: Every Day    Packs/day: 0.25    Types: Cigarettes   Smokeless tobacco: Never  Vaping Use   Vaping Use: Every day   Substances: Nicotine  Substance Use Topics   Alcohol use: No    Comment: occ   Drug use: No   Social History   Social History Narrative   Are you right handed or left handed Right    Are you currently employed ? yes   What is your current occupation?[employer]   Do you live at home alone?no   Who lives with you? 5 kids and wife   What type of home do you live in: 1 story or 2 story? one    Caffeine 3 cups daily      Objective:  Vital Signs:  There were no vitals taken for this visit.  General:*** General appearance: Awake and alert. No distress. Cooperative with exam.  Skin: No obvious rash or jaundice. HEENT: Atraumatic. Anicteric. Lungs: Non-labored breathing on room air  Heart: Regular Abdomen: Soft, non tender. Extremities: No edema. No obvious deformity.  Musculoskeletal: No obvious joint swelling. Psych: Affect appropriate.  Neurological: Mental Status: Alert. Speech fluent. No pseudobulbar affect Cranial Nerves: CNII: No RAPD. Visual fields intact. CNIII, IV, VI: PERRL. No nystagmus. EOMI. CN V: Facial sensation intact bilaterally to fine touch. Masseter clench strong. Jaw jerk***. CN VII: Facial muscles symmetric and strong. No ptosis at rest or after sustained upgaze***. CN VIII: Hears finger rub well  bilaterally. CN IX: No hypophonia. CN X: Palate elevates symmetrically. CN XI: Full strength shoulder shrug bilaterally. CN XII: Tongue protrusion full and midline. No atrophy or fasciculations. No significant dysarthria*** Motor: Tone is ***. *** fasciculations in *** extremities. *** atrophy. No grip or percussive myotonia.  Individual muscle group testing (MRC grade out of 5):  Movement     Neck flexion ***    Neck extension ***     Right Left   Shoulder abduction *** ***   Shoulder adduction *** ***   Shoulder ext rotation *** ***   Shoulder int rotation *** ***   Elbow flexion *** ***   Elbow  extension *** ***   Wrist extension *** ***   Wrist flexion *** ***   Finger abduction - FDI *** ***   Finger abduction - ADM *** ***   Finger extension *** ***   Finger distal flexion - 2/3 *** ***   Finger distal flexion - 4/5 *** ***   Thumb flexion - FPL *** ***   Thumb abduction - APB *** ***    Hip flexion *** ***   Hip extension *** ***   Hip adduction *** ***   Hip abduction *** ***   Knee extension *** ***   Knee flexion *** ***   Dorsiflexion *** ***   Plantarflexion *** ***   Inversion *** ***   Eversion *** ***   Great toe extension *** ***   Great toe flexion *** ***     Reflexes:  Right Left   Bicep *** ***   Tricep *** ***   BrRad *** ***   Knee *** ***   Ankle *** ***    Pathological Reflexes: Babinski: *** response bilaterally*** Hoffman: *** Troemner: *** Pectoral: *** Palmomental: *** Facial: *** Midline tap: *** Sensation: Pinprick: *** Vibration: *** Temperature: *** Proprioception: *** Coordination: Intact finger-to- nose-finger bilaterally. Romberg negative.*** Gait: Able to rise from chair with arms crossed unassisted. Normal, narrow-based gait. Able to tandem walk. Able to walk on toes and heels.***   Labs and Imaging review: New results: 09/16/22: PTH wnl Ionized Ca wnl Vit D: 16.58  EMG (09/20/22): NCV & EMG  Findings: Extensive electrodiagnostic evaluation of the left upper and lower limbs show: Left sural, superficial peroneal/fibular, median, ulnar, and radial sensory responses are within normal limits. Left peroneal/fibular (EDB), tibial (AH), median (APB), and ulnar (ADM) motor responses are within normal limits. Left H reflex latency is within normal limits. There is no evidence of active or chronic motor axon loss changes affecting any of the tested muscles. Motor unit configuration and recruitment pattern is within normal limits.   Impression: This is a normal electrodiagnostic evaluation. Specifically, there is no electrodiagnostic evidence of a large fiber sensorimotor neuropathy, left lumbosacral (L3-S1) or left cervical (C5-C8) motor radiculopathy, and no evidence of a left peroneal/fibular, tibial, median, or ulnar mononeuropathy.  Previously reviewed results: 08/20/22: B12: 648 Vit E: 10.1 (wnl) Copper: 83 (wnl)   TSH (05/12/22): 0.740 ESR (05/12/22): 2   Imaging: MRI brain wo contrast (06/07/22): FINDINGS: Brain:   Cerebral volume is normal.   No cortical encephalomalacia is identified. No significant cerebral white matter disease.   There is no acute infarct.   No evidence of an intracranial mass.   No chronic intracranial blood products.   No extra-axial fluid collection.   No midline shift.   Vascular: Maintained flow voids within the proximal large arterial vessels.   Skull and upper cervical spine: No focal suspicious marrow lesion.   Sinuses/Orbits: No mass or acute finding within the imaged orbits. No significant paranasal sinus disease.   IMPRESSION: Unremarkable non-contrast MRI appearance of the brain. No evidence of acute intracranial abnormality.   MRI cervical spine wo contrast (06/07/22): FINDINGS: Intermittently motion degraded exam. Most notably, there is mild-to-moderate motion degradation of the axial T2 TSE sequence, and moderate motion  degradation of the axial T2 GRE sequence.   Alignment: Slight C3-C4 and C4-C5 grade 1 retrolisthesis.   Vertebrae: Vertebral body height is maintained. No significant marrow edema or focal suspicious osseous lesion.   Cord: Within the limitations of motion degradation, no signal abnormality is identified  within the cervical spinal cord.   Posterior Fossa, vertebral arteries, paraspinal tissues: Posterior fossa better assessed on same-day brain MRI. Flow voids preserved within the imaged cervical vertebral arteries. No paraspinal mass or collection.   Disc levels:   Minimal multilevel disc degeneration.   C2-C3: No significant disc herniation or spinal canal stenosis. Uncovertebral hypertrophy on the right resulting in mild right neural foraminal narrowing.   C3-C4: Slight grade 1 retrolisthesis. No significant disc herniation or spinal canal stenosis. Uncovertebral hypertrophy on the right with resultant mild/moderate right neural foraminal narrowing.   C4-C5: Slight grade 1 retrolisthesis. No significant disc herniation or spinal canal stenosis. Mild uncovertebral hypertrophy on the right resulting in mild right neural foraminal narrowing.   C5-C6: Mild facet arthrosis. No significant disc herniation or stenosis.   C6-C7: No significant disc herniation or stenosis.   C7-T1: No significant disc herniation or stenosis.   IMPRESSION: Intermittently motion degraded examination.   Within the limitations of motion degradation, no signal abnormality is identified within the cervical spinal cord.   Cervical spondylosis, as described. No significant spinal canal stenosis. Uncovertebral hypertrophy results in right-sided foraminal narrowing at C2-C3 (mild), at C3-C4 (mild/moderate) and at C4-5 (mild).   Slight grade 1 retrolisthesis at C3-C4 and C4-C5.   MRI thoracic spine wo contrast (06/07/22): FINDINGS: The T1 level is excluded from the field of view. However, this  level is included in the field of view on the same day cervical spine MRI.   Mild intermittent motion degradation.   Alignment: No significant spondylolisthesis.   Vertebrae: No lumbar vertebral compression fracture. Small multilevel Schmorl nodes. No significant marrow edema or focal suspicious osseous lesion.   Cord: No signal abnormality is identified within the spinal cord at the thoracic levels   Paraspinal and other soft tissues: No abnormality identified within included portions of the thorax or upper abdomen/retroperitoneum. No paraspinal mass or collection.   Disc levels:   No more than mild disc degeneration within the thoracic spine.   At T8-T9, there is a very shallow broad-based central disc protrusion without significant spinal canal stenosis.   At T10-T11, there is a small right center disc protrusion which mildly effaces the ventral thecal sac (without spinal cord mass effect).   No significant disc herniation or spinal canal stenosis at the remaining levels. No significant foraminal stenosis within the thoracic spine.   Mild facet arthrosis at T11-T12 and T12-L1.   IMPRESSION: Mildly motion degraded exam.   No signal abnormality identified within the spinal cord at the thoracic levels.   Mild thoracic spondylosis, as outlined. Most notably at T10-T11, a small right center disc protrusion mildly effaces the ventral thecal sac (without spinal cord mass effect).   MRI lumbar spine wo contrast (05/10/22): FINDINGS: Segmentation:  Standard.   Alignment:  Mild levocurvature.  No listhesis.   Vertebrae:  No acute fracture or suspicious osseous lesion.   Conus medullaris and cauda equina: Conus extends to the L1-L2 level. Conus and cauda equina appear normal.   Paraspinal and other soft tissues: Negative.   Disc levels:   T12-L1: Seen only on the sagittal images. No significant disc bulge, spinal canal stenosis, or neural foraminal narrowing.    L1-L2: No significant disc bulge. No spinal canal stenosis or neural foraminal narrowing.   L2-L3: No significant disc bulge. No spinal canal stenosis or neural foraminal narrowing.   L3-L4: No significant disc bulge. No spinal canal stenosis or neural foraminal narrowing.   L4-L5: No significant disc bulge.  No spinal canal stenosis or neural foraminal narrowing.   L5-S1: No significant disc bulge. No spinal canal stenosis or neural foraminal narrowing.   IMPRESSION: No significant degenerative changes. No spinal canal stenosis or neural foraminal narrowing.   Left leg DVT US (08/19/22): IMPRESSION: No evidence of left lower extremity deep venous thrombosis.  Assessment/Plan:  This is Jake Walsh, a 37 y.o. male with: ***   Plan: *** -Continue vitamin D 1000 IU daily  Return to clinic in ***  Total time spent reviewing records, interview, history/exam, documentation, and coordination of care on day of encounter:  *** min  Kai Levins, MD

## 2022-11-19 DIAGNOSIS — R9431 Abnormal electrocardiogram [ECG] [EKG]: Secondary | ICD-10-CM | POA: Diagnosis not present

## 2022-11-19 DIAGNOSIS — R0789 Other chest pain: Secondary | ICD-10-CM | POA: Diagnosis not present

## 2022-11-19 DIAGNOSIS — R079 Chest pain, unspecified: Secondary | ICD-10-CM | POA: Diagnosis not present

## 2022-11-19 DIAGNOSIS — F17210 Nicotine dependence, cigarettes, uncomplicated: Secondary | ICD-10-CM | POA: Diagnosis not present

## 2022-11-24 ENCOUNTER — Ambulatory Visit: Payer: BC Managed Care – PPO | Admitting: Neurology

## 2022-12-03 ENCOUNTER — Ambulatory Visit (HOSPITAL_COMMUNITY)
Admission: RE | Admit: 2022-12-03 | Discharge: 2022-12-03 | Disposition: A | Discharge status: Home / Self Care | Payer: BC Managed Care – PPO | Source: Ambulatory Visit | Attending: Family Medicine | Admitting: Family Medicine

## 2022-12-03 ENCOUNTER — Ambulatory Visit: Payer: BC Managed Care – PPO | Admitting: Family Medicine

## 2022-12-03 ENCOUNTER — Encounter: Payer: Self-pay | Admitting: Family Medicine

## 2022-12-03 VITALS — BP 116/77 | HR 81 | Temp 98.6°F | Ht 71.0 in | Wt 214.0 lb

## 2022-12-03 DIAGNOSIS — R0989 Other specified symptoms and signs involving the circulatory and respiratory systems: Secondary | ICD-10-CM

## 2022-12-03 DIAGNOSIS — M79662 Pain in left lower leg: Secondary | ICD-10-CM

## 2022-12-03 NOTE — Patient Instructions (Signed)
Checking you for clot of left leg.

## 2022-12-03 NOTE — Progress Notes (Signed)
Subjective: CC: Left calf pain PCP: Janora Norlander, DO [id number] H Heap is a 37 y.o. male presenting to clinic today for:  1.  Left calf pain Patient reports abrupt onset of left-sided posterior calf pain 2 days ago.  Denies any preceding injury.  No change in activity prior.  He notes that it exquisitely tender to palpation.  Feels like the left actually is smaller than the right.  He has had history of low back pain rating down that left lower extremity previously that could not determine etiology of.  He reports clotting history in his grandmother but knows no other folks that might have any clotting history.  Denies any changes in medications except for 1 Flexeril that was given a while back in for some issues.  He was not able to tolerate it so he has discontinued that.  Denies any chest pain, heart palpitations, shortness of breath.  Tolerating exercise at baseline except for the left calf pain   ROS: Per HPI  Allergies  Allergen Reactions   Capsaicin Swelling   Past Medical History:  Diagnosis Date   Anxiety    Coarse tremors     Current Outpatient Medications:    gabapentin (NEURONTIN) 600 MG tablet, Take 0.5-1 tablets (300-600 mg total) by mouth 3 (three) times daily as needed (pain)., Disp: 90 tablet, Rfl: 2   ibuprofen (ADVIL) 600 MG tablet, Take 1 tablet (600 mg total) by mouth every 8 (eight) hours as needed., Disp: 30 tablet, Rfl: 0 Social History   Socioeconomic History   Marital status: Married    Spouse name: Tillie Rung   Number of children: 3   Years of education: Not on file   Highest education level: Some college, no degree  Occupational History   Not on file  Tobacco Use   Smoking status: Every Day    Packs/day: 0.25    Types: Cigarettes   Smokeless tobacco: Never  Vaping Use   Vaping Use: Every day   Substances: Nicotine  Substance and Sexual Activity   Alcohol use: No    Comment: occ   Drug use: No   Sexual activity: Yes    Birth  control/protection: None  Other Topics Concern   Not on file  Social History Narrative   Are you right handed or left handed Right    Are you currently employed ? yes   What is your current occupation?[employer]   Do you live at home alone?no   Who lives with you? 5 kids and wife   What type of home do you live in: 1 story or 2 story? one    Caffeine 3 cups daily   Social Determinants of Health   Financial Resource Strain: Not on file  Food Insecurity: Not on file  Transportation Needs: Not on file  Physical Activity: Not on file  Stress: Not on file  Social Connections: Not on file  Intimate Partner Violence: Not on file   Family History  Problem Relation Age of Onset   Arthritis/Rheumatoid Mother    Gout Mother    Arthritis Mother    Parkinson's disease Father    Diabetes Father    Bipolar disorder Father    Gallstones Sister    Hodgkin's lymphoma Sister    ADD / ADHD Son    ODD Son     Objective: Office vital signs reviewed. BP 116/77   Pulse 81   Temp 98.6 F (37 C)   Ht '5\' 11"'$  (1.803  m)   Wt 214 lb (97.1 kg)   SpO2 98%   BMI 29.85 kg/m   Physical Examination:  General: Awake, alert, well nourished, No acute distress Cardio: regular rate and rhythm, S1S2 heard, no murmurs appreciated Pulm: clear to auscultation bilaterally, no wheezes, rhonchi or rales; normal work of breathing on room air MSK:   Left calf: No gross swelling, heat or erythema but he has exquisite tenderness palpation along the posterior calf along the venous system.  Positive Homan.  Assessment/ Plan: 37 y.o. male   Pain of left calf - Plan: US Venous Img Lower Unilateral Left  Homans sign present - Plan: US Venous Img Lower Unilateral Left  Stat venous ultrasound for rule out of DVT.  He had positive Bevelyn Buckles' sign exquisite tenderness to palpation to the posterior calf.  Orders Placed This Encounter  Procedures   US Venous Img Lower Unilateral Left    Standing Status:    Future    Standing Expiration Date:   12/04/2023    Order Specific Question:   Reason for Exam (SYMPTOM  OR DIAGNOSIS REQUIRED)    Answer:   pos homans. Left calf pain and TTP posteriorly x2 days    Order Specific Question:   Preferred imaging location?    Answer:   Internal   No orders of the defined types were placed in this encounter.    Janora Norlander, DO [place] [phone]

## 2022-12-16 ENCOUNTER — Ambulatory Visit: Payer: BC Managed Care – PPO | Admitting: Family Medicine

## 2022-12-23 ENCOUNTER — Other Ambulatory Visit: Payer: Self-pay | Admitting: Family Medicine

## 2022-12-23 DIAGNOSIS — G8929 Other chronic pain: Secondary | ICD-10-CM

## 2022-12-23 DIAGNOSIS — R531 Weakness: Secondary | ICD-10-CM

## 2022-12-29 ENCOUNTER — Encounter: Payer: Self-pay | Admitting: Family Medicine

## 2022-12-29 ENCOUNTER — Ambulatory Visit: Payer: BC Managed Care – PPO | Admitting: Family Medicine

## 2022-12-29 VITALS — BP 122/67 | HR 70 | Temp 98.2°F | Ht 71.0 in | Wt 215.2 lb

## 2022-12-29 DIAGNOSIS — R29898 Other symptoms and signs involving the musculoskeletal system: Secondary | ICD-10-CM | POA: Diagnosis not present

## 2022-12-29 DIAGNOSIS — F419 Anxiety disorder, unspecified: Secondary | ICD-10-CM

## 2022-12-29 DIAGNOSIS — F32A Depression, unspecified: Secondary | ICD-10-CM

## 2022-12-29 DIAGNOSIS — S0003XA Contusion of scalp, initial encounter: Secondary | ICD-10-CM

## 2022-12-29 MED ORDER — QUETIAPINE FUMARATE 25 MG PO TABS: 25.0000 mg | ORAL_TABLET | Freq: Every day | ORAL | 1 refills | Status: DC

## 2022-12-29 NOTE — Progress Notes (Signed)
Subjective:  Patient ID: Jake Walsh, male    DOB: Nov 04, 1985  Age: 37 y.o. MRN: [id number]  CC: Fall, Head Injury, and Anxiety   HPI JOSEARMANDO EVATT presents for falling out of the shower 2 days ago. Denies ongoing sx. No LOC. Had some swelling at point of impact on door frame. No pain, but it is a little tender. Some Tinnitus since it occurred. 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  He has weakness of the left leg. It gave way causing him to fall.  He has also had problems with anxiety and depression. Off med's for years, but the weak leg and inability to get relief have caused it to flare back up.      12/29/2022    9:18 AM 12/29/2022    9:17 AM 12/03/2022    9:40 AM  Depression screen PHQ 2/9  Decreased Interest 2 0 2  Down, Depressed, Hopeless 2 0 1  PHQ - 2 Score 4 0 3  Altered sleeping 3  3  Tired, decreased energy 2  2  Change in appetite 2  1  Feeling bad or failure about yourself  2  2  Trouble concentrating 3  0  Moving slowly or fidgety/restless 2  3  Suicidal thoughts 1  0  PHQ-9 Score 19  14  Difficult doing work/chores Somewhat difficult  Somewhat difficult      12/29/2022    9:19 AM 12/03/2022    9:40 AM 12/03/2022    9:00 AM 07/28/2022    1:40 PM  GAD 7 : Generalized Anxiety Score  Nervous, Anxious, on Edge 3 2 1 1   Control/stop worrying 3 2 1 1   Worry too much - different things 3 2 1 1   Trouble relaxing 2 2 1 1   Restless 2 2 1 1   Easily annoyed or irritable 3 3 1 1   Afraid - awful might happen 2 0 1 1  Total GAD 7 Score 18 13 7 7   Anxiety Difficulty Somewhat difficult Somewhat difficult Somewhat difficult Somewhat difficult      History Thomasjames has a past medical history of Anxiety and Coarse tremors.   He has no past surgical history on file.   His family history includes ADD / ADHD in his son; Arthritis in his mother; Arthritis/Rheumatoid in his mother; Bipolar disorder in his father; Diabetes in his father;  Gallstones in his sister; Gout in his mother; Hodgkin's lymphoma in his sister; ODD in his son; Parkinson's disease in his father.He reports that he has been smoking cigarettes. He has been smoking an average of .25 packs per day. He has never used smokeless tobacco. He reports that he does not drink alcohol and does not use drugs.    ROS Review of Systems  Constitutional:  Negative for fever.  Respiratory:  Negative for shortness of breath.   Cardiovascular:  Negative for chest pain.  Musculoskeletal:  Negative for arthralgias.  Skin:  Negative for rash.    Objective:  BP 122/67   Pulse 70   Temp 98.2 F (36.8 C)   Ht 5\' 11"  (1.803 m)   Wt 215 lb 3.2 oz (97.6 kg)   SpO2 99%   BMI 30.01 kg/m   BP Readings from Last 3 Encounters:  12/29/22 122/67  12/03/22 116/77  09/16/22 134/86    Wt Readings from Last 3 Encounters:  12/29/22 215 lb 3.2 oz (97.6 kg)  12/03/22 214 lb (97.1 kg)  09/16/22 210 lb 12.8 oz (95.6 kg)     Physical Exam Vitals reviewed.  Constitutional:      Appearance: He is well-developed.  HENT:     Head: Normocephalic.     Comments: There is tenderness at the posterior scalp near the crown. Minimal edema but no contusion visible.    Right Ear: External ear normal.     Left Ear: External ear normal.     Mouth/Throat:     Pharynx: No oropharyngeal exudate or posterior oropharyngeal erythema.  Eyes:     Pupils: Pupils are equal, round, and reactive to light.  Cardiovascular:     Rate and Rhythm: Normal rate and regular rhythm.     Heart sounds: No murmur heard. Pulmonary:     Effort: No respiratory distress.     Breath sounds: Normal breath sounds.  Musculoskeletal:        General: Deformity (significant limp of left leg.) present.     Cervical back: Normal range of motion and neck supple.  Neurological:     Mental Status: He is alert and oriented to person, place, and time.       Assessment & Plan:   Shafiq was seen today for fall, head  injury and anxiety.  Diagnoses and all orders for this visit:  Contusion of scalp, initial encounter  Anxiety and depression  Left leg weakness  Other orders -     QUEtiapine (SEROQUEL) 25 MG tablet; Take 1 tablet (25 mg total) by mouth at bedtime.       I am having [name] "Bill" start on QUEtiapine. I am also having him maintain his ibuprofen and gabapentin.  Allergies as of 12/29/2022       Reactions   Capsaicin Swelling        Medication List        Accurate as of December 29, 2022  8:29 PM. If you have any questions, ask your nurse or doctor.          gabapentin 600 MG tablet Commonly known as: NEURONTIN Take 0.5-1 tablets (300-600 mg total) by mouth 3 (three) times daily as needed. Pain (NEEDS TO BE SEEN BEFORE NEXT REFILL)   ibuprofen 600 MG tablet Commonly known as: ADVIL Take 1 tablet (600 mg total) by mouth every 8 (eight) hours as needed.   QUEtiapine 25 MG tablet Commonly known as: SEROQUEL Take 1 tablet (25 mg total) by mouth at bedtime. Started by: Claretta Fraise, MD         Follow-up: Return in about 1 month (around 01/29/2023) for Depression, leg weakness with PCP.  Claretta Fraise, M.D.

## 2023-02-18 ENCOUNTER — Ambulatory Visit: Payer: BC Managed Care – PPO | Admitting: Family Medicine

## 2023-02-21 ENCOUNTER — Ambulatory Visit: Payer: BC Managed Care – PPO | Admitting: Family Medicine

## 2023-02-21 ENCOUNTER — Encounter: Payer: Self-pay | Admitting: Family Medicine

## 2023-02-21 VITALS — BP 120/78 | HR 97 | Temp 98.6°F | Ht 71.0 in | Wt 214.0 lb

## 2023-02-21 DIAGNOSIS — M5442 Lumbago with sciatica, left side: Secondary | ICD-10-CM | POA: Diagnosis not present

## 2023-02-21 DIAGNOSIS — G8929 Other chronic pain: Secondary | ICD-10-CM

## 2023-02-21 DIAGNOSIS — F419 Anxiety disorder, unspecified: Secondary | ICD-10-CM

## 2023-02-21 DIAGNOSIS — G479 Sleep disorder, unspecified: Secondary | ICD-10-CM

## 2023-02-21 DIAGNOSIS — F32A Depression, unspecified: Secondary | ICD-10-CM

## 2023-02-21 DIAGNOSIS — R531 Weakness: Secondary | ICD-10-CM

## 2023-02-21 MED ORDER — PROPRANOLOL HCL 10 MG PO TABS: 10.0000 mg | ORAL_TABLET | Freq: Two times a day (BID) | ORAL | 0 refills | Status: DC

## 2023-02-21 MED ORDER — QUETIAPINE FUMARATE 50 MG PO TABS: 50.0000 mg | ORAL_TABLET | Freq: Every day | ORAL | 0 refills | Status: DC

## 2023-02-21 MED ORDER — GABAPENTIN 600 MG PO TABS: 300.0000 mg | ORAL_TABLET | Freq: Three times a day (TID) | ORAL | 3 refills | Status: DC | PRN

## 2023-02-21 NOTE — Progress Notes (Signed)
Subjective: CC: Anxiety PCP: Raliegh Ip, DO Jake Walsh is a 37 y.o. male presenting to clinic today for:  1.  Anxiety Patient reports that he continues to have breakthrough anxiety.  He was previously treated with trazodone, Celexa and propranolol when he was seen at [facility].  He is self titrated from these medicines because he felt like his affect was too flat on them.  In March, he was seen by Dr. Darlyn Read, who started him on Seroquel after reporting some issues with sleep.  He does report Seroquel has helped a little bit but not substantially.  He continues to be worried about multiple issues including his own medical issues with his leg pain which is persistent.  He also worries about his wife, who is now driving to multiple banks to work.  He continues to have nighttime awakening and feels anxious throughout the day.   ROS: Per HPI  Allergies  Allergen Reactions   Capsaicin Swelling   Past Medical History:  Diagnosis Date   Anxiety    Coarse tremors     Current Outpatient Medications:    gabapentin (NEURONTIN) 600 MG tablet, Take 0.5-1 tablets (300-600 mg total) by mouth 3 (three) times daily as needed. Pain (NEEDS TO BE SEEN BEFORE NEXT REFILL), Disp: 90 tablet, Rfl: 0   ibuprofen (ADVIL) 600 MG tablet, Take 1 tablet (600 mg total) by mouth every 8 (eight) hours as needed., Disp: 30 tablet, Rfl: 0   QUEtiapine (SEROQUEL) 25 MG tablet, Take 1 tablet (25 mg total) by mouth at bedtime., Disp: 90 tablet, Rfl: 1 Social History   Socioeconomic History   Marital status: Married    Spouse name: Enrique Sack   Number of children: 3   Years of education: Not on file   Highest education level: Some college, no degree  Occupational History   Not on file  Tobacco Use   Smoking status: Every Day    Packs/day: .25    Types: Cigarettes   Smokeless tobacco: Never  Vaping Use   Vaping Use: Every day   Substances: Nicotine  Substance and Sexual Activity   Alcohol use:  No    Comment: occ   Drug use: No   Sexual activity: Yes    Birth control/protection: None  Other Topics Concern   Not on file  Social History Narrative   Are you right handed or left handed Right    Are you currently employed ? yes   What is your current occupation?[employer]   Do you live at home alone?no   Who lives with you? 5 kids and wife   What type of home do you live in: 1 story or 2 story? one    Caffeine 3 cups daily   Social Determinants of Health   Financial Resource Strain: Not on file  Food Insecurity: Not on file  Transportation Needs: Not on file  Physical Activity: Not on file  Stress: Not on file  Social Connections: Not on file  Intimate Partner Violence: Not on file   Family History  Problem Relation Age of Onset   Arthritis/Rheumatoid Mother    Gout Mother    Arthritis Mother    Parkinson's disease Father    Diabetes Father    Bipolar disorder Father    Gallstones Sister    Hodgkin's lymphoma Sister    ADD / ADHD Son    ODD Son     Objective: Office vital signs reviewed. BP 120/78  Pulse 97   Temp 98.6 F (37 C)   Ht 5\' 11"  (1.803 m)   Wt 214 lb (97.1 kg)   SpO2 96%   BMI 29.85 kg/m   Physical Examination:  General: Awake, alert, well nourished, No acute distress HEENT: sclera white, MMM Cardio: regular rate and rhythm, S1S2 heard, no murmurs appreciated Pulm: clear to auscultation bilaterally, no wheezes, rhonchi or rales; normal work of breathing on room air MSK: Ambulating independently with antalgic gait and station Psych: Mood stable, speech normal.  Does not appear to be responding to internal stimuli     02/21/2023    2:58 PM 12/29/2022    9:18 AM 12/29/2022    9:17 AM  Depression screen PHQ 2/9  Decreased Interest 2 2 0  Down, Depressed, Hopeless 2 2 0  PHQ - 2 Score 4 4 0  Altered sleeping 2 3   Tired, decreased energy 2 2   Change in appetite 2 2   Feeling bad or failure about yourself  2 2   Trouble  concentrating 2 3   Moving slowly or fidgety/restless 1 2   Suicidal thoughts 1 1   PHQ-9 Score 16 19   Difficult doing work/chores  Somewhat difficult       02/21/2023    2:58 PM 12/29/2022    9:19 AM 12/03/2022    9:40 AM 12/03/2022    9:00 AM  GAD 7 : Generalized Anxiety Score  Nervous, Anxious, on Edge 2 3 2 1   Control/stop worrying 2 3 2 1   Worry too much - different things 2 3 2 1   Trouble relaxing 2 2 2 1   Restless 1 2 2 1   Easily annoyed or irritable 2 3 3 1   Afraid - awful might happen 2 2 0 1  Total GAD 7 Score 13 18 13 7   Anxiety Difficulty Somewhat difficult Somewhat difficult Somewhat difficult Somewhat difficult   Assessment/ Plan: 37 y.o. male   Anxiety and depression - Plan: QUEtiapine (SEROQUEL) 50 MG tablet, propranolol (INDERAL) 10 MG tablet  Sleep difficulties - Plan: QUEtiapine (SEROQUEL) 50 MG tablet  Weakness - Plan: gabapentin (NEURONTIN) 600 MG tablet  Chronic left-sided low back pain with left-sided sciatica - Plan: gabapentin (NEURONTIN) 600 MG tablet  Cervical advance to 50 mg nightly.  Add propranolol twice daily but may consider increasing to 3 times daily pending response for anxiety.  Will plan to reassess again in 6 weeks.  May need to consider referral to integrated behavioral health for counseling services as well  Gabapentin renewed for chronic back issues and leg pain.  He seems to be doing fairly well on 300 mg alone but I will still give him the 3 to 600 mg 3 times daily as needed should he need doses for flareups.  Anticipate FMLA paperwork to be dropped off soon  No orders of the defined types were placed in this encounter.  No orders of the defined types were placed in this encounter.    Raliegh Ip, DO [facility] [phone]

## 2023-02-22 ENCOUNTER — Encounter: Payer: Self-pay | Admitting: Family Medicine

## 2023-02-22 NOTE — Telephone Encounter (Signed)
Ccing this to you as per office protocol.

## 2023-02-25 ENCOUNTER — Telehealth: Payer: Self-pay | Admitting: Family Medicine

## 2023-02-25 DIAGNOSIS — Z0289 Encounter for other administrative examinations: Secondary | ICD-10-CM

## 2023-02-25 NOTE — Telephone Encounter (Signed)
Polly Cobia dropped off FMLA forms to be completed and signed.  Form Fee Paid? (Y/N)    Yes        If NO, form is placed on front office manager desk to hold until payment received. If YES, then form will be placed in the RX/HH Nurse Coordinators box for completion.  Form will not be processed until payment is received

## 2023-02-28 DIAGNOSIS — K0889 Other specified disorders of teeth and supporting structures: Secondary | ICD-10-CM | POA: Diagnosis not present

## 2023-02-28 DIAGNOSIS — Z20822 Contact with and (suspected) exposure to covid-19: Secondary | ICD-10-CM | POA: Diagnosis not present

## 2023-02-28 DIAGNOSIS — R509 Fever, unspecified: Secondary | ICD-10-CM | POA: Diagnosis not present

## 2023-02-28 DIAGNOSIS — F17210 Nicotine dependence, cigarettes, uncomplicated: Secondary | ICD-10-CM | POA: Diagnosis not present

## 2023-02-28 DIAGNOSIS — R6884 Jaw pain: Secondary | ICD-10-CM | POA: Diagnosis not present

## 2023-02-28 DIAGNOSIS — K029 Dental caries, unspecified: Secondary | ICD-10-CM | POA: Diagnosis not present

## 2023-02-28 DIAGNOSIS — K047 Periapical abscess without sinus: Secondary | ICD-10-CM | POA: Diagnosis not present

## 2023-03-01 DIAGNOSIS — F17210 Nicotine dependence, cigarettes, uncomplicated: Secondary | ICD-10-CM | POA: Diagnosis not present

## 2023-03-01 DIAGNOSIS — K0889 Other specified disorders of teeth and supporting structures: Secondary | ICD-10-CM | POA: Diagnosis not present

## 2023-03-01 DIAGNOSIS — R220 Localized swelling, mass and lump, head: Secondary | ICD-10-CM | POA: Diagnosis not present

## 2023-03-01 DIAGNOSIS — J392 Other diseases of pharynx: Secondary | ICD-10-CM | POA: Diagnosis not present

## 2023-03-01 DIAGNOSIS — K047 Periapical abscess without sinus: Secondary | ICD-10-CM | POA: Diagnosis not present

## 2023-03-01 DIAGNOSIS — J390 Retropharyngeal and parapharyngeal abscess: Secondary | ICD-10-CM | POA: Diagnosis not present

## 2023-03-01 DIAGNOSIS — Z1152 Encounter for screening for COVID-19: Secondary | ICD-10-CM | POA: Diagnosis not present

## 2023-03-01 DIAGNOSIS — Z20822 Contact with and (suspected) exposure to covid-19: Secondary | ICD-10-CM | POA: Diagnosis not present

## 2023-03-01 DIAGNOSIS — K029 Dental caries, unspecified: Secondary | ICD-10-CM | POA: Diagnosis not present

## 2023-03-02 DIAGNOSIS — M272 Inflammatory conditions of jaws: Secondary | ICD-10-CM | POA: Diagnosis not present

## 2023-03-02 DIAGNOSIS — E669 Obesity, unspecified: Secondary | ICD-10-CM | POA: Diagnosis not present

## 2023-03-02 DIAGNOSIS — D62 Acute posthemorrhagic anemia: Secondary | ICD-10-CM | POA: Diagnosis not present

## 2023-03-02 DIAGNOSIS — Z9911 Dependence on respirator [ventilator] status: Secondary | ICD-10-CM | POA: Diagnosis not present

## 2023-03-02 DIAGNOSIS — K122 Cellulitis and abscess of mouth: Secondary | ICD-10-CM | POA: Diagnosis not present

## 2023-03-02 DIAGNOSIS — J9811 Atelectasis: Secondary | ICD-10-CM | POA: Diagnosis not present

## 2023-03-02 DIAGNOSIS — J029 Acute pharyngitis, unspecified: Secondary | ICD-10-CM | POA: Diagnosis not present

## 2023-03-02 DIAGNOSIS — E872 Acidosis, unspecified: Secondary | ICD-10-CM | POA: Diagnosis not present

## 2023-03-02 DIAGNOSIS — J984 Other disorders of lung: Secondary | ICD-10-CM | POA: Diagnosis not present

## 2023-03-02 DIAGNOSIS — K047 Periapical abscess without sinus: Secondary | ICD-10-CM | POA: Diagnosis not present

## 2023-03-02 DIAGNOSIS — R000 Tachycardia, unspecified: Secondary | ICD-10-CM | POA: Diagnosis not present

## 2023-03-02 DIAGNOSIS — K0889 Other specified disorders of teeth and supporting structures: Secondary | ICD-10-CM | POA: Diagnosis not present

## 2023-03-02 DIAGNOSIS — M7989 Other specified soft tissue disorders: Secondary | ICD-10-CM | POA: Diagnosis not present

## 2023-03-02 DIAGNOSIS — J390 Retropharyngeal and parapharyngeal abscess: Secondary | ICD-10-CM | POA: Diagnosis not present

## 2023-03-02 DIAGNOSIS — F32A Depression, unspecified: Secondary | ICD-10-CM | POA: Diagnosis not present

## 2023-03-02 DIAGNOSIS — Z98818 Other dental procedure status: Secondary | ICD-10-CM | POA: Diagnosis not present

## 2023-03-02 DIAGNOSIS — R918 Other nonspecific abnormal finding of lung field: Secondary | ICD-10-CM | POA: Diagnosis not present

## 2023-03-02 DIAGNOSIS — L0889 Other specified local infections of the skin and subcutaneous tissue: Secondary | ICD-10-CM | POA: Diagnosis not present

## 2023-03-02 DIAGNOSIS — R220 Localized swelling, mass and lump, head: Secondary | ICD-10-CM | POA: Diagnosis not present

## 2023-03-02 DIAGNOSIS — R0603 Acute respiratory distress: Secondary | ICD-10-CM | POA: Diagnosis not present

## 2023-03-02 DIAGNOSIS — Z6832 Body mass index (BMI) 32.0-32.9, adult: Secondary | ICD-10-CM | POA: Diagnosis not present

## 2023-03-02 DIAGNOSIS — Z4682 Encounter for fitting and adjustment of non-vascular catheter: Secondary | ICD-10-CM | POA: Diagnosis not present

## 2023-03-02 DIAGNOSIS — Z452 Encounter for adjustment and management of vascular access device: Secondary | ICD-10-CM | POA: Diagnosis not present

## 2023-03-02 DIAGNOSIS — Z03823 Encounter for observation for suspected inserted (injected) foreign body ruled out: Secondary | ICD-10-CM | POA: Diagnosis not present

## 2023-03-02 DIAGNOSIS — G629 Polyneuropathy, unspecified: Secondary | ICD-10-CM | POA: Diagnosis not present

## 2023-03-02 DIAGNOSIS — I959 Hypotension, unspecified: Secondary | ICD-10-CM | POA: Diagnosis not present

## 2023-03-02 DIAGNOSIS — R0902 Hypoxemia: Secondary | ICD-10-CM | POA: Diagnosis not present

## 2023-03-02 DIAGNOSIS — Z430 Encounter for attention to tracheostomy: Secondary | ICD-10-CM | POA: Diagnosis not present

## 2023-03-02 DIAGNOSIS — R519 Headache, unspecified: Secondary | ICD-10-CM | POA: Diagnosis not present

## 2023-03-02 DIAGNOSIS — R0689 Other abnormalities of breathing: Secondary | ICD-10-CM | POA: Diagnosis not present

## 2023-03-02 DIAGNOSIS — I10 Essential (primary) hypertension: Secondary | ICD-10-CM | POA: Diagnosis not present

## 2023-03-02 DIAGNOSIS — L0211 Cutaneous abscess of neck: Secondary | ICD-10-CM | POA: Diagnosis not present

## 2023-03-02 DIAGNOSIS — F17210 Nicotine dependence, cigarettes, uncomplicated: Secondary | ICD-10-CM | POA: Diagnosis not present

## 2023-03-02 DIAGNOSIS — Z9889 Other specified postprocedural states: Secondary | ICD-10-CM | POA: Diagnosis not present

## 2023-03-02 DIAGNOSIS — I469 Cardiac arrest, cause unspecified: Secondary | ICD-10-CM | POA: Diagnosis not present

## 2023-03-02 DIAGNOSIS — L0201 Cutaneous abscess of face: Secondary | ICD-10-CM | POA: Diagnosis not present

## 2023-03-02 DIAGNOSIS — K029 Dental caries, unspecified: Secondary | ICD-10-CM | POA: Diagnosis not present

## 2023-03-02 DIAGNOSIS — Z1152 Encounter for screening for COVID-19: Secondary | ICD-10-CM | POA: Diagnosis not present

## 2023-03-02 DIAGNOSIS — J986 Disorders of diaphragm: Secondary | ICD-10-CM | POA: Diagnosis not present

## 2023-03-02 DIAGNOSIS — F419 Anxiety disorder, unspecified: Secondary | ICD-10-CM | POA: Diagnosis not present

## 2023-03-02 DIAGNOSIS — D72829 Elevated white blood cell count, unspecified: Secondary | ICD-10-CM | POA: Diagnosis not present

## 2023-03-02 DIAGNOSIS — J9601 Acute respiratory failure with hypoxia: Secondary | ICD-10-CM | POA: Diagnosis not present

## 2023-03-02 DIAGNOSIS — Z930 Tracheostomy status: Secondary | ICD-10-CM | POA: Diagnosis not present

## 2023-03-02 DIAGNOSIS — R1310 Dysphagia, unspecified: Secondary | ICD-10-CM | POA: Diagnosis not present

## 2023-03-10 NOTE — Telephone Encounter (Signed)
PCP completed and signed FMLA forms. They have been faxed to [employer] at fax number [phone]. Patient has not been contact he is currently in the hospital.

## 2023-04-04 ENCOUNTER — Telehealth (INDEPENDENT_AMBULATORY_CARE_PROVIDER_SITE_OTHER): Payer: BC Managed Care – PPO | Admitting: Family Medicine

## 2023-04-04 ENCOUNTER — Encounter: Payer: Self-pay | Admitting: Family Medicine

## 2023-04-04 DIAGNOSIS — Z91199 Patient's noncompliance with other medical treatment and regimen due to unspecified reason: Secondary | ICD-10-CM

## 2023-04-04 NOTE — Progress Notes (Signed)
Attempted to reach via Mychart and via phone but could not reach patient.  He really needs a hospital follow up as he has had quite the medical journey over the last several weeks.  Please reschedule him for check up ASAP  Start time: 4:30pm (sent text and LVM); 4:34pm (second link sent); 4:36pm (final link sent) End time: I disconnected after waiting in the virtual waiting room for 10 minutes  Rudra Hobbins [name], DO [facility] [phone]

## 2023-04-11 DIAGNOSIS — Z09 Encounter for follow-up examination after completed treatment for conditions other than malignant neoplasm: Secondary | ICD-10-CM | POA: Diagnosis not present

## 2023-04-11 DIAGNOSIS — K047 Periapical abscess without sinus: Secondary | ICD-10-CM | POA: Diagnosis not present

## 2023-04-22 ENCOUNTER — Ambulatory Visit: Payer: BC Managed Care – PPO | Admitting: Family Medicine

## 2023-04-22 ENCOUNTER — Encounter: Payer: Self-pay | Admitting: Family Medicine

## 2023-04-22 VITALS — BP 113/70 | HR 90 | Temp 99.0°F | Ht 71.0 in | Wt 217.8 lb

## 2023-04-22 DIAGNOSIS — F32A Depression, unspecified: Secondary | ICD-10-CM | POA: Diagnosis not present

## 2023-04-22 DIAGNOSIS — Z9889 Other specified postprocedural states: Secondary | ICD-10-CM

## 2023-04-22 DIAGNOSIS — F419 Anxiety disorder, unspecified: Secondary | ICD-10-CM

## 2023-04-22 NOTE — Patient Instructions (Signed)

## 2023-04-22 NOTE — Progress Notes (Unsigned)
Subjective: CC:*** PCP: Raliegh Ip, DO WUJ:[id number] H Grewell is a 37 y.o. male presenting to clinic today for:  1. ***   ROS: Per HPI  Allergies  Allergen Reactions   Capsaicin Swelling   Past Medical History:  Diagnosis Date   Anxiety    Coarse tremors     Current Outpatient Medications:    gabapentin (NEURONTIN) 600 MG tablet, Take 0.5-1 tablets (300-600 mg total) by mouth 3 (three) times daily as needed (pain)., Disp: 90 tablet, Rfl: 3   ibuprofen (ADVIL) 600 MG tablet, Take 1 tablet (600 mg total) by mouth every 8 (eight) hours as needed., Disp: 30 tablet, Rfl: 0   propranolol (INDERAL) 10 MG tablet, Take 1 tablet (10 mg total) by mouth 2 (two) times daily. For anxiety, Disp: 180 tablet, Rfl: 0   QUEtiapine (SEROQUEL) 50 MG tablet, Take 1 tablet (50 mg total) by mouth at bedtime. **dose change, Disp: 90 tablet, Rfl: 0 Social History   Socioeconomic History   Marital status: Married    Spouse name: Enrique Sack   Number of children: 3   Years of education: Not on file   Highest education level: Some college, no degree  Occupational History   Not on file  Tobacco Use   Smoking status: Every Day    Current packs/day: 0.25    Types: Cigarettes   Smokeless tobacco: Never  Vaping Use   Vaping status: Every Day   Substances: Nicotine  Substance and Sexual Activity   Alcohol use: No    Comment: occ   Drug use: No   Sexual activity: Yes    Birth control/protection: None  Other Topics Concern   Not on file  Social History Narrative   Are you right handed or left handed Right    Are you currently employed ? yes   What is your current occupation?[employer]   Do you live at home alone?no   Who lives with you? 5 kids and wife   What type of home do you live in: 1 story or 2 story? one    Caffeine 3 cups daily   Social Determinants of Health   Financial Resource Strain: Low Risk  (03/10/2023)   Received from [facility], [facility]   Overall  Financial Resource Strain (CARDIA)    Difficulty of Paying Living Expenses: Not hard at all  Food Insecurity: No Food Insecurity (03/10/2023)   Received from [facility], [facility]   Hunger Vital Sign    Worried About Running Out of Food in the Last Year: Never true    Ran Out of Food in the Last Year: Never true  Transportation Needs: No Transportation Needs (03/10/2023)   Received from [facility], [facility]   [facility] - Transportation    Lack of Transportation (Medical): No    Lack of Transportation (Non-Medical): No  Physical Activity: Not on file  Stress: Not on file  Social Connections: Not on file  Intimate Partner Violence: Not on file   Family History  Problem Relation Age of Onset   Arthritis/Rheumatoid Mother    Gout Mother    Arthritis Mother    Parkinson's disease Father    Diabetes Father    Bipolar disorder Father    Gallstones Sister    Hodgkin's lymphoma Sister    ADD / ADHD Son    ODD Son     Objective: Office vital signs reviewed. BP 113/70   Pulse 90  Temp 99 F (37.2 C) (Temporal)   Ht 5\' 11"  (1.803 m)   Wt 217 lb 12.8 oz (98.8 kg)   SpO2 94%   BMI 30.38 kg/m   Physical Examination:  General: Awake, alert, *** nourished, No acute distress HEENT: Normal    Neck: No masses palpated. No lymphadenopathy    Ears: Tympanic membranes intact, normal light reflex, no erythema, no bulging    Eyes: PERRLA, extraocular membranes intact, sclera ***    Nose: nasal turbinates moist, *** nasal discharge    Throat: moist mucus membranes, no erythema, *** tonsillar exudate.  Airway is patent Cardio: regular rate and rhythm, S1S2 heard, no murmurs appreciated Pulm: clear to auscultation bilaterally, no wheezes, rhonchi or rales; normal work of breathing on room air GI: soft, non-tender, non-distended, bowel sounds present x4, no hepatomegaly, no splenomegaly, no masses GU: external vaginal tissue ***, cervix ***, *** punctate lesions on cervix  appreciated, *** discharge from cervical os, *** bleeding, *** cervical motion tenderness, *** abdominal/ adnexal masses Extremities: warm, well perfused, No edema, cyanosis or clubbing; +*** pulses bilaterally MSK: *** gait and *** station Skin: dry; intact; no rashes or lesions Neuro: *** Strength and light touch sensation grossly intact, *** DTRs ***/4  Assessment/ Plan: 37 y.o. male   ***  No orders of the defined types were placed in this encounter.  No orders of the defined types were placed in this encounter.    Raliegh Ip, DO [facility] [phone]

## 2023-05-17 ENCOUNTER — Ambulatory Visit: Payer: BC Managed Care – PPO | Admitting: Family Medicine

## 2023-05-17 ENCOUNTER — Encounter: Payer: Self-pay | Admitting: Family Medicine

## 2023-05-17 VITALS — BP 113/76 | HR 68 | Temp 98.0°F | Ht 71.0 in | Wt 215.8 lb

## 2023-05-17 DIAGNOSIS — K047 Periapical abscess without sinus: Secondary | ICD-10-CM | POA: Diagnosis not present

## 2023-05-17 MED ORDER — AMOXICILLIN-POT CLAVULANATE 875-125 MG PO TABS
1.0000 | ORAL_TABLET | Freq: Two times a day (BID) | ORAL | 0 refills | Status: DC
Start: 2023-05-17 — End: 2023-05-31

## 2023-05-17 MED ORDER — PREDNISONE 10 MG PO TABS: ORAL_TABLET | ORAL | 0 refills | Status: DC

## 2023-05-17 NOTE — Progress Notes (Signed)
Subjective:  Patient ID: Jake Walsh, male    DOB: 04-Jul-1986  Age: 37 y.o. MRN: [id number]  CC: Facial Swelling (LEFT SIDE JAW / NECK SWELLING AND NO DENTAL PAIN)   HPI Jake Walsh presents for swelling in the left cheek and neck near area where he had a tooth removed recently. 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      04/22/2023    3:10 PM 02/21/2023    2:58 PM 12/29/2022    9:18 AM  Depression screen PHQ 2/9  Decreased Interest 1 2 2   Down, Depressed, Hopeless 1 2 2   PHQ - 2 Score 2 4 4   Altered sleeping 2 2 3   Tired, decreased energy 3 2 2   Change in appetite 2 2 2   Feeling bad or failure about yourself  3 2 2   Trouble concentrating 3 2 3   Moving slowly or fidgety/restless 2 1 2   Suicidal thoughts 1 1 1   PHQ-9 Score 18 16 19   Difficult doing work/chores Somewhat difficult  Somewhat difficult    History Jake Walsh has a past medical history of Anxiety and Coarse tremors.   He has a past surgical history that includes Dental surgery.   His family history includes ADD / ADHD in his son; Arthritis in his mother; Arthritis/Rheumatoid in his mother; Bipolar disorder in his father; Diabetes in his father; Gallstones in his sister; Gout in his mother; Hodgkin's lymphoma in his sister; ODD in his son; Parkinson's disease in his father.He reports that he has been smoking cigarettes. He has never used smokeless tobacco. He reports that he does not drink alcohol and does not use drugs.    ROS Review of Systems  Constitutional:  Negative for activity change, appetite change, fatigue and fever.  HENT:  Negative for trouble swallowing.     Objective:  BP 113/76   Pulse 68   Temp 98 F (36.7 C)   Ht 5\' 11"  (1.803 m)   Wt 215 lb 12.8 oz (97.9 kg)   SpO2 97%   BMI 30.10 kg/m   BP Readings from Last 3 Encounters:  05/17/23 113/76  04/22/23 113/70  02/21/23 120/78    Wt Readings from Last 3  Encounters:  05/17/23 215 lb 12.8 oz (97.9 kg)  04/22/23 217 lb 12.8 oz (98.8 kg)  02/21/23 214 lb (97.1 kg)     Physical Exam HENT:     Head:     Jaw: Tenderness and swelling (at left cheek) present. No trismus.     Salivary Glands: Right salivary gland is not tender.  Musculoskeletal:     Cervical back: Neck supple. Tenderness (left angle of mandible.) present.       Assessment & Plan:   Jake Walsh" was seen today for facial swelling.  Diagnoses and all orders for this visit:  Dental infection  Other orders -     amoxicillin-clavulanate (AUGMENTIN) 875-125 MG tablet; Take 1 tablet by mouth 2 (two) times daily. Take all of this medication -     predniSONE (DELTASONE) 10 MG tablet; Take 5 daily for 2 days followed by 4,3,2 and 1 for 2 days each.       I am having Jake Cobia "Bill" start on amoxicillin-clavulanate and predniSONE. I am also having him maintain his ibuprofen, QUEtiapine, propranolol, and gabapentin.  Allergies as of 05/17/2023       Reactions   Capsaicin Swelling  Medication List        Accurate as of May 17, 2023 11:59 PM. If you have any questions, ask your nurse or doctor.          amoxicillin-clavulanate 875-125 MG tablet Commonly known as: AUGMENTIN Take 1 tablet by mouth 2 (two) times daily. Take all of this medication Started by: Anais Denslow   gabapentin 600 MG tablet Commonly known as: NEURONTIN Take 0.5-1 tablets (300-600 mg total) by mouth 3 (three) times daily as needed (pain).   ibuprofen 600 MG tablet Commonly known as: ADVIL Take 1 tablet (600 mg total) by mouth every 8 (eight) hours as needed.   predniSONE 10 MG tablet Commonly known as: DELTASONE Take 5 daily for 2 days followed by 4,3,2 and 1 for 2 days each. Started by: Sennie Borden   propranolol 10 MG tablet Commonly known as: INDERAL Take 1 tablet (10 mg total) by mouth 2 (two) times daily. For anxiety   QUEtiapine 50 MG tablet Commonly  known as: SEROQUEL Take 1 tablet (50 mg total) by mouth at bedtime. **dose change         Follow-up: Return if symptoms worsen or fail to improve.  Mechele Claude, M.D.

## 2023-05-31 ENCOUNTER — Emergency Department (HOSPITAL_COMMUNITY): Payer: BC Managed Care – PPO

## 2023-05-31 ENCOUNTER — Other Ambulatory Visit: Payer: Self-pay

## 2023-05-31 ENCOUNTER — Telehealth (INDEPENDENT_AMBULATORY_CARE_PROVIDER_SITE_OTHER): Payer: BC Managed Care – PPO | Admitting: Family Medicine

## 2023-05-31 ENCOUNTER — Emergency Department (HOSPITAL_COMMUNITY)
Admission: EM | Admit: 2023-05-31 | Discharge: 2023-05-31 | Disposition: A | Discharge status: Home / Self Care | Payer: BC Managed Care – PPO | Attending: Student | Admitting: Student

## 2023-05-31 ENCOUNTER — Encounter: Payer: Self-pay | Admitting: Family Medicine

## 2023-05-31 DIAGNOSIS — N5089 Other specified disorders of the male genital organs: Secondary | ICD-10-CM | POA: Diagnosis not present

## 2023-05-31 DIAGNOSIS — F32A Depression, unspecified: Secondary | ICD-10-CM

## 2023-05-31 DIAGNOSIS — F17210 Nicotine dependence, cigarettes, uncomplicated: Secondary | ICD-10-CM | POA: Insufficient documentation

## 2023-05-31 DIAGNOSIS — R000 Tachycardia, unspecified: Secondary | ICD-10-CM | POA: Diagnosis not present

## 2023-05-31 DIAGNOSIS — F419 Anxiety disorder, unspecified: Secondary | ICD-10-CM

## 2023-05-31 DIAGNOSIS — N452 Orchitis: Secondary | ICD-10-CM | POA: Insufficient documentation

## 2023-05-31 DIAGNOSIS — D72829 Elevated white blood cell count, unspecified: Secondary | ICD-10-CM | POA: Insufficient documentation

## 2023-05-31 DIAGNOSIS — N50819 Testicular pain, unspecified: Secondary | ICD-10-CM | POA: Diagnosis not present

## 2023-05-31 DIAGNOSIS — Z20822 Contact with and (suspected) exposure to covid-19: Secondary | ICD-10-CM | POA: Diagnosis not present

## 2023-05-31 DIAGNOSIS — G479 Sleep disorder, unspecified: Secondary | ICD-10-CM

## 2023-05-31 DIAGNOSIS — R7989 Other specified abnormal findings of blood chemistry: Secondary | ICD-10-CM | POA: Diagnosis not present

## 2023-05-31 DIAGNOSIS — N5082 Scrotal pain: Secondary | ICD-10-CM | POA: Diagnosis not present

## 2023-05-31 DIAGNOSIS — N433 Hydrocele, unspecified: Secondary | ICD-10-CM | POA: Diagnosis not present

## 2023-05-31 LAB — COMPREHENSIVE METABOLIC PANEL
ALT: 19 U/L (ref 0–44)
AST: 22 U/L (ref 15–41)
Albumin: 3.6 g/dL (ref 3.5–5.0)
Alkaline Phosphatase: 52 U/L (ref 38–126)
Anion gap: 10 (ref 5–15)
BUN: 8 mg/dL (ref 6–20)
CO2: 21 mmol/L — ABNORMAL LOW (ref 22–32)
Calcium: 8.8 mg/dL — ABNORMAL LOW (ref 8.9–10.3)
Chloride: 103 mmol/L (ref 98–111)
Creatinine, Ser: 0.69 mg/dL (ref 0.61–1.24)
GFR, Estimated: >60 mL/min (ref >60)
Glucose, Bld: 133 mg/dL — ABNORMAL HIGH (ref 70–99)
Potassium: 3.6 mmol/L (ref 3.5–5.1)
Sodium: 134 mmol/L — ABNORMAL LOW (ref 135–145)
Total Bilirubin: 0.5 mg/dL (ref 0.3–1.2)
Total Protein: 7.2 g/dL (ref 6.5–8.1)

## 2023-05-31 LAB — LACTIC ACID, PLASMA
Lactic Acid, Venous: 2.2 mmol/L (ref 0.5–1.9)
Lactic Acid, Venous: 0.9 mmol/L (ref 0.5–1.9)

## 2023-05-31 LAB — CBC WITH DIFFERENTIAL/PLATELET
Abs Immature Granulocytes: 0.02 10*3/uL (ref 0.00–0.07)
Basophils Absolute: 0 10*3/uL (ref 0.0–0.1)
Basophils Relative: 0 %
Eosinophils Absolute: 0.2 10*3/uL (ref 0.0–0.5)
Eosinophils Relative: 2 %
HCT: 39.4 % (ref 39.0–52.0)
Hemoglobin: 13.4 g/dL (ref 13.0–17.0)
Immature Granulocytes: 0 %
Lymphocytes Relative: 13 %
Lymphs Abs: 1.4 10*3/uL (ref 0.7–4.0)
MCH: 28.5 pg (ref 26.0–34.0)
MCHC: 34 g/dL (ref 30.0–36.0)
MCV: 83.7 fL (ref 80.0–100.0)
Monocytes Absolute: 0.8 10*3/uL (ref 0.1–1.0)
Monocytes Relative: 7 %
Neutro Abs: 8.2 10*3/uL — ABNORMAL HIGH (ref 1.7–7.7)
Neutrophils Relative %: 78 %
Platelets: 201 10*3/uL (ref 150–400)
RBC: 4.71 MIL/uL (ref 4.22–5.81)
RDW: 12.9 % (ref 11.5–15.5)
WBC: 10.6 10*3/uL — ABNORMAL HIGH (ref 4.0–10.5)
nRBC: 0 % (ref 0.0–0.2)

## 2023-05-31 LAB — URINALYSIS, ROUTINE W REFLEX MICROSCOPIC
Bilirubin Urine: NEGATIVE
Glucose, UA: 50 mg/dL — AB
Hgb urine dipstick: NEGATIVE
Ketones, ur: NEGATIVE mg/dL
Leukocytes,Ua: NEGATIVE
Nitrite: NEGATIVE
Protein, ur: NEGATIVE mg/dL
Specific Gravity, Urine: 1.023 (ref 1.005–1.030)
pH: 6 (ref 5.0–8.0)

## 2023-05-31 LAB — RESP PANEL BY RT-PCR (RSV, FLU A&B, COVID)  RVPGX2
Influenza A by PCR: NEGATIVE
Influenza B by PCR: NEGATIVE
Resp Syncytial Virus by PCR: NEGATIVE
SARS Coronavirus 2 by RT PCR: NEGATIVE

## 2023-05-31 MED ORDER — QUETIAPINE FUMARATE 50 MG PO TABS: 50.0000 mg | ORAL_TABLET | Freq: Every day | ORAL | 3 refills | Status: DC

## 2023-05-31 MED ORDER — PROPRANOLOL HCL 10 MG PO TABS: 10.0000 mg | ORAL_TABLET | Freq: Two times a day (BID) | ORAL | 3 refills | Status: DC

## 2023-05-31 MED ORDER — DOXYCYCLINE HYCLATE 100 MG PO TABS
100.0000 mg | ORAL_TABLET | Freq: Two times a day (BID) | ORAL | 0 refills | Status: AC
Start: 2023-05-31 — End: 2023-06-10

## 2023-05-31 MED ORDER — NAPROXEN 375 MG PO TABS: 375.0000 mg | ORAL_TABLET | Freq: Two times a day (BID) | ORAL | 0 refills | Status: DC

## 2023-05-31 MED ORDER — LACTATED RINGERS IV BOLUS: 1000.0000 mL | Freq: Once | INTRAVENOUS | Status: DC

## 2023-05-31 MED ORDER — PROPRANOLOL HCL 10 MG PO TABS
10.0000 mg | ORAL_TABLET | Freq: Two times a day (BID) | ORAL | Status: DC
  Administered 2023-05-31: 10 mg via ORAL
  Filled 2023-05-31: qty 1

## 2023-05-31 NOTE — Progress Notes (Signed)
MyChart Video visit  Subjective: CC: Mood follow-up PCP: Jake Ip, DO Jake Walsh is a 37 y.o. male. Patient provides verbal consent for consult held via video.  Due to COVID-19 pandemic this visit was conducted virtually. This visit type was conducted due to national recommendations for restrictions regarding the COVID-19 Pandemic (e.g. social distancing, sheltering in place) in an effort to limit this patient's exposure and mitigate transmission in our community. All issues noted in this document were discussed and addressed.  A physical exam was not performed with this format.   Location of patient: home Location of provider: [facility] Others present for call: none  1.  Mood Reports that anxiety and mood overall seem to be better with propranolol and Seroquel.  Satisfied with current dosing.  He reports to me that this morning he actually went to the ER for testicular pain and swelling.  He had ultrasound performed there which was largely unrevealing except for some small hydroceles.  He notes that he was discharged with oral NSAID but no antibiotics.  No known exposures to his GC or chlamydia but was recently tested for this and that is still pending.  He was told apparently that the blood work would be looking for evidence of bacteremia given recent treatment for dental infection.  He has blood cultures pending that are so far negative since this morning.  He notes that he has an appointment with Urology scheduled for Tuesday.   ROS: Per HPI  Allergies  Allergen Reactions   Capsaicin Swelling   Past Medical History:  Diagnosis Date   Anxiety    Coarse tremors     Current Outpatient Medications:    amoxicillin-clavulanate (AUGMENTIN) 875-125 MG tablet, Take 1 tablet by mouth 2 (two) times daily. Take all of this medication, Disp: 20 tablet, Rfl: 0   gabapentin (NEURONTIN) 600 MG tablet, Take 0.5-1 tablets (300-600 mg total) by mouth 3 (three) times daily as needed  (pain)., Disp: 90 tablet, Rfl: 3   ibuprofen (ADVIL) 600 MG tablet, Take 1 tablet (600 mg total) by mouth every 8 (eight) hours as needed., Disp: 30 tablet, Rfl: 0   naproxen (NAPROSYN) 375 MG tablet, Take 1 tablet (375 mg total) by mouth 2 (two) times daily., Disp: 20 tablet, Rfl: 0   predniSONE (DELTASONE) 10 MG tablet, Take 5 daily for 2 days followed by 4,3,2 and 1 for 2 days each., Disp: 30 tablet, Rfl: 0   propranolol (INDERAL) 10 MG tablet, Take 1 tablet (10 mg total) by mouth 2 (two) times daily. For anxiety, Disp: 180 tablet, Rfl: 0   QUEtiapine (SEROQUEL) 50 MG tablet, Take 1 tablet (50 mg total) by mouth at bedtime. **dose change, Disp: 90 tablet, Rfl: 0 No current facility-administered medications for this visit.  Facility-Administered Medications Ordered in Other Visits:    lactated ringers bolus 1,000 mL, 1,000 mL, Intravenous, Once, Kommor, Madison, MD, Held at 05/31/23 [phone]   propranolol (INDERAL) tablet 10 mg, 10 mg, Oral, BID, Kommor, Madison, MD, 10 mg at 05/31/23 0615  Gen: nontoxic male, NAD HEENT: no gross facial swelling appreciated Psych: mood stable, speech normal, good eye contact  Assessment/ Plan: 37 y.o. male   Orchitis - Plan: doxycycline (VIBRA-TABS) 100 MG tablet  Anxiety and depression - Plan: propranolol (INDERAL) 10 MG tablet, QUEtiapine (SEROQUEL) 50 MG tablet  Sleep difficulties - Plan: QUEtiapine (SEROQUEL) 50 MG tablet  I am going to go ahead and empirically treated with doxycycline.  I also doubt gonorrhea or chlamydia  but given reports of fevers recently I am concerned about this.  I agree with urologic evaluation.  He seemed to be very resistant to them placing an IV today and I asked that he keep an open mind should he need this, particularly if IV antibiotics are warranted going forward.  I have given him a work note and place it in his EMR  With regards to anxiety and depression and sleep difficulty these are moderately well-controlled with  current regimen and he does not wish to have any adjustment to dosing right now.  Refills have been sent he may follow-up with me in 3 to 6 months, sooner if concerns arise  Start time: 11:11am End time: 11:22am  Total time spent on patient care (including video visit/ documentation): 16 minutes  Jake Hollander [name], DO [facility] [phone]

## 2023-05-31 NOTE — ED Triage Notes (Addendum)
Pt c/o testicle swelling and lower abdominal pain x1 day. Also reports fever at home of 104.2, tylenol taken @ 1900 last night. Temp 99.2 in triage.

## 2023-05-31 NOTE — ED Provider Notes (Signed)
[place] EMERGENCY DEPARTMENT AT [facility] Provider Note  CSN: [id number] Arrival date & time: 05/31/23 0449  Chief Complaint(s) Testicle Pain  HPI Jake Walsh is a 37 y.o. male with PMH anxiety, recent prolonged hospitalization in May 2024 after failed intubation requiring emergency tracheostomy in the setting of a tooth extraction with subsequent reversal of tracheostomy who presents to the emergency department for evaluation of persistent fevers and testicular pain and swelling.  He states that he recently saw his oral surgeon yesterday who states that his teeth are looking well.  He states that since hospital discharge he has had intermittent fevers and he was placed on Augmentin and an extended course of prednisone on 05/17/2023 and has completed this course but is still having intermittent fevers.  States that over the last 24 hours he started to develop perineal and testicular pain.  Currently denies chest pain, shortness of breath, nausea, vomiting, facial pain, dental pain or other systemic symptoms.  Denies new sexual partners or penile discharge.  No penile rash.   Past Medical History Past Medical History:  Diagnosis Date   Anxiety    Coarse tremors    Patient Active Problem List   Diagnosis Date Noted   Ear pain 10/14/2021   Encounter for vasectomy counseling 08/12/2021   Annual physical exam 02/06/2021   Nicotine dependence, uncomplicated 02/06/2021   Acute pain of left knee 11/07/2020   Home Medication(s) Prior to Admission medications   Medication Sig Start Date End Date Taking? Authorizing Provider  naproxen (NAPROSYN) 375 MG tablet Take 1 tablet (375 mg total) by mouth 2 (two) times daily. 05/31/23  Yes Levenia Skalicky, MD  doxycycline (VIBRA-TABS) 100 MG tablet Take 1 tablet (100 mg total) by mouth 2 (two) times daily for 10 days. 05/31/23 06/10/23  Raliegh Ip, DO  gabapentin (NEURONTIN) 600 MG tablet Take 0.5-1 tablets (300-600 mg total) by  mouth 3 (three) times daily as needed (pain). 02/21/23   Raliegh Ip, DO  propranolol (INDERAL) 10 MG tablet Take 1 tablet (10 mg total) by mouth 2 (two) times daily. For anxiety 05/31/23   Delynn Flavin M, DO  QUEtiapine (SEROQUEL) 50 MG tablet Take 1 tablet (50 mg total) by mouth at bedtime. 05/31/23   Raliegh Ip, DO                                                                                                                                    Past Surgical History Past Surgical History:  Procedure Laterality Date   DENTAL SURGERY     Family History Family History  Problem Relation Age of Onset   Arthritis/Rheumatoid Mother    Gout Mother    Arthritis Mother    Parkinson's disease Father    Diabetes Father    Bipolar disorder Father    Gallstones Sister    Hodgkin's lymphoma Sister    ADD / ADHD Son  ODD Son     Social History Social History   Tobacco Use   Smoking status: Every Day    Current packs/day: 0.25    Types: Cigarettes   Smokeless tobacco: Never  Vaping Use   Vaping status: Every Day   Substances: Nicotine  Substance Use Topics   Alcohol use: No    Comment: occ   Drug use: No   Allergies Capsaicin  Review of Systems Review of Systems  Constitutional:  Positive for fever.  Genitourinary:  Positive for penile swelling and testicular pain.    Physical Exam Vital Signs  I have reviewed the triage vital signs BP 113/73   Pulse 86   Temp 99.2 F (37.3 C)   Resp 18   SpO2 100%   Physical Exam Constitutional:      General: He is not in acute distress.    Appearance: Normal appearance.  HENT:     Head: Normocephalic and atraumatic.     Nose: No congestion or rhinorrhea.  Eyes:     General:        Right eye: No discharge.        Left eye: No discharge.     Extraocular Movements: Extraocular movements intact.     Pupils: Pupils are equal, round, and reactive to light.  Cardiovascular:     Rate and Rhythm: Normal rate and  regular rhythm.     Heart sounds: No murmur heard. Pulmonary:     Effort: No respiratory distress.     Breath sounds: No wheezing or rales.  Abdominal:     General: There is no distension.     Tenderness: There is no abdominal tenderness.  Genitourinary:    Comments: Testicular swelling, tenderness Musculoskeletal:        General: Normal range of motion.     Cervical back: Normal range of motion.  Skin:    General: Skin is warm and dry.  Neurological:     General: No focal deficit present.     Mental Status: He is alert.     ED Results and Treatments Labs (all labs ordered are listed, but only abnormal results are displayed) Labs Reviewed  URINALYSIS, ROUTINE W REFLEX MICROSCOPIC - Abnormal; Notable for the following components:      Result Value   Glucose, UA 50 (*)    All other components within normal limits  COMPREHENSIVE METABOLIC PANEL - Abnormal; Notable for the following components:   Sodium 134 (*)    CO2 21 (*)    Glucose, Bld 133 (*)    Calcium 8.8 (*)    All other components within normal limits  CBC WITH DIFFERENTIAL/PLATELET - Abnormal; Notable for the following components:   WBC 10.6 (*)    Neutro Abs 8.2 (*)    All other components within normal limits  LACTIC ACID, PLASMA - Abnormal; Notable for the following components:   Lactic Acid, Venous 2.2 (*)    All other components within normal limits  CULTURE, BLOOD (ROUTINE X 2)  CULTURE, BLOOD (ROUTINE X 2)  RESP PANEL BY RT-PCR (RSV, FLU A&B, COVID)  RVPGX2  LACTIC ACID, PLASMA  GC/CHLAMYDIA PROBE AMP ([place]) NOT AT [facility]  Radiology US SCROTUM W/DOPPLER  Result Date: 05/31/2023 CLINICAL DATA:  Scrotal pain and swelling.  Concern for torsion. EXAM: SCROTAL ULTRASOUND DOPPLER ULTRASOUND OF THE TESTICLES TECHNIQUE: Complete ultrasound examination of the testicles, epididymis, and  other scrotal structures was performed. Color and spectral Doppler ultrasound were also utilized to evaluate blood flow to the testicles. COMPARISON:  None Available. FINDINGS: Right testicle Measurements: 5.8 x 3.1 x 4.2 cm. No mass or microlithiasis visualized. There is slight heterogeneity to the parenchyma believed most likely due to edema. Left testicle Measurements: 5.7 x 3.3 x 3.8 cm. No mass or microlithiasis visualized. There is slight heterogeneity to the parenchyma believed most likely due to edema. Right epididymis: Only the epididymal head is visible. The head of the epididymis is unremarkable except for a 3 mm simple cyst. Left epididymis:  Normal in size and appearance. Hydrocele:  Trace bilaterally. Varicocele:  None visualized. Pulsed Doppler interrogation of both testes demonstrates low resistance bilateral symmetric increased arterial flow and unremarkable venous waveforms bilaterally. IMPRESSION: 1. Both testes demonstrate increased flow and likely parenchymal edema. Findings are nonspecific but may be seen with orchitis. No evidence of torsion or mass. 2. Left epididymis unremarkable, right epididymis only visible on the head portion without suspicious findings. 3. Trace bilateral hydroceles. Electronically Signed   By: Almira Bar M.D.   On: 05/31/2023 07:10    Pertinent labs & imaging results that were available during my care of the patient were reviewed by me and considered in my medical decision making (see MDM for details).  Medications Ordered in ED Medications - No data to display                                                                                                                                   Procedures Procedures  (including critical care time)  Medical Decision Making / ED Course   This patient presents to the ED for concern of testicular pain and swelling, fever, this involves an extensive number of treatment options, and is a complaint that carries  with it a high risk of complications and morbidity.  The differential diagnosis includes epididymitis, orchitis, torsion, abscess, bacteremia  MDM: Patient seen emergency room for evaluation of testicular pain and swelling, fevers.  Physical exam with bilateral testicular pain and swelling, cremasteric reflexes intact bilaterally.  Patient arrives tachycardic but states that that he is not taking his propranolol and this was given to him leading to resolution of his tachycardia.  Laboratory evaluation with a very mild leukocytosis to 10.6 which I expect is likely downtrending leukocytosis from his previous prolonged steroid taper.  Initial lactic acid is 2.2 but repeat lactic acid 0.9 despite no intervention.  Urinalysis unremarkable.  Ultrasound concerning for orchitis.  Given that the patient has had multiple rounds of antibiotics and a majority of orchitis is viral in nature, we will not administer antibiotics in the emergency department today and instead follow  blood cultures as needed.  I did offer to place an IV and reevaluate his postsurgical area with CT imaging but patient was adamant about not receiving an IV today given improving vital signs this is not unreasonable to defer at this time.  He has follow-up with his PCP over video visit today and he will establish outpatient follow-up with urology after leaving the ER today.  I placed an outpatient referral.  We will follow on his GC chlamydia testing but at this time he does not meet inpatient criteria for admission he is safe for discharge with outpatient follow-up.   Additional history obtained:  -External records from outside source obtained and reviewed including: Chart review including previous notes, labs, imaging, consultation notes   Lab Tests: -I ordered, reviewed, and interpreted labs.   The pertinent results include:   Labs Reviewed  URINALYSIS, ROUTINE W REFLEX MICROSCOPIC - Abnormal; Notable for the following components:       Result Value   Glucose, UA 50 (*)    All other components within normal limits  COMPREHENSIVE METABOLIC PANEL - Abnormal; Notable for the following components:   Sodium 134 (*)    CO2 21 (*)    Glucose, Bld 133 (*)    Calcium 8.8 (*)    All other components within normal limits  CBC WITH DIFFERENTIAL/PLATELET - Abnormal; Notable for the following components:   WBC 10.6 (*)    Neutro Abs 8.2 (*)    All other components within normal limits  LACTIC ACID, PLASMA - Abnormal; Notable for the following components:   Lactic Acid, Venous 2.2 (*)    All other components within normal limits  CULTURE, BLOOD (ROUTINE X 2)  CULTURE, BLOOD (ROUTINE X 2)  RESP PANEL BY RT-PCR (RSV, FLU A&B, COVID)  RVPGX2  LACTIC ACID, PLASMA  GC/CHLAMYDIA PROBE AMP () NOT AT [facility]      EKG   EKG Interpretation Date/Time:  Tuesday May 31 2023 05:41:08 EDT Ventricular Rate:  114 PR Interval:  152 QRS Duration:  58 QT Interval:  348 QTC Calculation: 480 R Axis:   64  Text Interpretation: Sinus tachycardia Confirmed by Keno Caraway ([id number]) on 05/31/2023 6:19:17 AM         Imaging Studies ordered: I ordered imaging studies including scrotal ultrasound I independently visualized and interpreted imaging. I agree with the radiologist interpretation   Medicines ordered and prescription drug management: Meds ordered this encounter  Medications   DISCONTD: lactated ringers bolus 1,000 mL   DISCONTD: propranolol (INDERAL) tablet 10 mg   naproxen (NAPROSYN) 375 MG tablet    Sig: Take 1 tablet (375 mg total) by mouth 2 (two) times daily.    Dispense:  20 tablet    Refill:  0    -I have reviewed the patients home medicines and have made adjustments as needed  Critical interventions none    Cardiac Monitoring: The patient was maintained on a cardiac monitor.  I personally viewed and interpreted the cardiac monitored which showed an underlying rhythm of: NSR  Social Determinants of  Health:  Factors impacting patients care include: none   Reevaluation: After the interventions noted above, I reevaluated the patient and found that they have :stayed the same  Co morbidities that complicate the patient evaluation  Past Medical History:  Diagnosis Date   Anxiety    Coarse tremors       Dispostion: I considered admission for this patient, but at this time he does not meet inpatient  criteria for admission and he is safe for discharge with outpatient follow-up     Final Clinical Impression(s) / ED Diagnoses Final diagnoses:  Orchitis  Elevated lactic acid level     @PCDICTATION @    Larrisa Cravey, Wyn Forster, MD 05/31/23 1747

## 2023-06-01 LAB — GC/CHLAMYDIA PROBE AMP (~~LOC~~) NOT AT ARMC
Chlamydia: NEGATIVE
Comment: NEGATIVE
Comment: NORMAL
Neisseria Gonorrhea: NEGATIVE

## 2023-06-05 LAB — CULTURE, BLOOD (ROUTINE X 2)
Culture: NO GROWTH
Culture: NO GROWTH
Special Requests: ADEQUATE

## 2023-06-07 ENCOUNTER — Encounter: Payer: Self-pay | Admitting: Urology

## 2023-06-07 ENCOUNTER — Ambulatory Visit (INDEPENDENT_AMBULATORY_CARE_PROVIDER_SITE_OTHER): Payer: BC Managed Care – PPO | Admitting: Urology

## 2023-06-07 VITALS — BP 124/81 | HR 63 | Ht 70.0 in | Wt 212.0 lb

## 2023-06-07 DIAGNOSIS — N452 Orchitis: Secondary | ICD-10-CM | POA: Diagnosis not present

## 2023-06-07 DIAGNOSIS — N5082 Scrotal pain: Secondary | ICD-10-CM

## 2023-06-07 NOTE — Progress Notes (Signed)
Assessment: 1. Orchitis   2. Scrotal pain     Plan: I personally reviewed the patient's chart including provider notes, lab and imaging results. Continue doxycycline for total of 10 days. Continue Naprosyn twice daily Return to office in 3 weeks.  Chief Complaint:  Chief Complaint  Patient presents with   orchitis    History of Present Illness:  Jake Walsh is a 37 y.o. male who is seen in consultation from Glendora Score, MD for evaluation of scrotal pain. He presented to the emergency room on 05/31/2023 with a 3 day history of scrotal pain and swelling. Urinalysis was unremarkable. Scrotal ultrasound demonstrated increased flow to both testicles with some parenchymal edema consistent with possible orchitis. GC and Chlamydia were negative. He was placed on doxycycline by his PCP on 06/01/23. He has noted a decrease in the scrotal swelling.  He continues to have some intermittent discomfort in both testicles. He is not having any significant voiding symptoms.  No dysuria or gross hematuria.   Past Medical History:  Past Medical History:  Diagnosis Date   Anxiety    Coarse tremors     Past Surgical History:  Past Surgical History:  Procedure Laterality Date   DENTAL SURGERY      Allergies:  Allergies  Allergen Reactions   Capsaicin Swelling    Family History:  Family History  Problem Relation Age of Onset   Arthritis/Rheumatoid Mother    Gout Mother    Arthritis Mother    Parkinson's disease Father    Diabetes Father    Bipolar disorder Father    Gallstones Sister    Hodgkin's lymphoma Sister    ADD / ADHD Son    ODD Son     Social History:  Social History   Tobacco Use   Smoking status: Every Day    Current packs/day: 0.25    Types: Cigarettes   Smokeless tobacco: Never  Vaping Use   Vaping status: Every Day   Substances: Nicotine  Substance Use Topics   Alcohol use: No    Comment: occ   Drug use: No    Review of symptoms:   Constitutional:  Negative for unexplained weight loss, night sweats, fever, chills ENT:  Negative for nose bleeds, sinus pain, painful swallowing CV:  Negative for chest pain, shortness of breath, exercise intolerance, palpitations, loss of consciousness Resp:  Negative for cough, wheezing, shortness of breath GI:  Negative for nausea, vomiting, diarrhea, bloody stools GU:  Positives noted in HPI; otherwise negative for gross hematuria, dysuria, urinary incontinence Neuro:  Negative for seizures, poor balance, limb weakness, slurred speech Psych:  Negative for lack of energy, depression, anxiety Endocrine:  Negative for polydipsia, polyuria, symptoms of hypoglycemia (dizziness, hunger, sweating) Hematologic:  Negative for anemia, purpura, petechia, prolonged or excessive bleeding, use of anticoagulants  Allergic:  Negative for difficulty breathing or choking as a result of exposure to anything; no shellfish allergy; no allergic response (rash/itch) to materials, foods  Physical exam: BP 124/81   Pulse 63   Ht 5\' 10"  (1.778 m)   Wt 212 lb (96.2 kg)   BMI 30.42 kg/m  GENERAL APPEARANCE:  Well appearing, well developed, well nourished, NAD HEENT:  Atraumatic, normocephalic, oropharynx clear NECK:  Supple without lymphadenopathy or thyromegaly ABDOMEN:  Soft, non-tender, no masses EXTREMITIES:  Moves all extremities well, without clubbing, cyanosis, or edema NEUROLOGIC:  Alert and oriented x 3, normal gait, CN II-XII grossly intact MENTAL STATUS:  appropriate BACK:  Non-tender to  palpation, No CVAT SKIN:  Warm, dry, and intact GU: Penis:  circumcised Meatus: Normal Scrotum: no erythema or edema Testis: no masses; slightly tender bilaterally Epididymis: tender bilaterally   Results: U/A: negative

## 2023-06-17 LAB — URINALYSIS, ROUTINE W REFLEX MICROSCOPIC
Bilirubin, UA: NEGATIVE
Glucose, UA: NEGATIVE
Ketones, UA: NEGATIVE
Leukocytes,UA: NEGATIVE
Nitrite, UA: NEGATIVE
Protein,UA: NEGATIVE
RBC, UA: NEGATIVE
Specific Gravity, UA: >1.03 — ABNORMAL HIGH (ref 1.005–1.030)
Urobilinogen, Ur: 0.2 mg/dL (ref 0.2–1.0)
pH, UA: 5.5 (ref 5.0–7.5)

## 2023-06-22 ENCOUNTER — Telehealth: Payer: Self-pay | Admitting: Urology

## 2023-06-22 NOTE — Telephone Encounter (Signed)
Called regarding FMLA paperwork

## 2023-06-23 NOTE — Telephone Encounter (Signed)
Pt aware that paperwork has been returned via fax.

## 2023-06-29 ENCOUNTER — Ambulatory Visit (INDEPENDENT_AMBULATORY_CARE_PROVIDER_SITE_OTHER): Payer: BC Managed Care – PPO | Admitting: Urology

## 2023-06-29 ENCOUNTER — Encounter: Payer: Self-pay | Admitting: Urology

## 2023-06-29 VITALS — BP 129/85 | HR 74 | Ht 71.0 in | Wt 214.0 lb

## 2023-06-29 DIAGNOSIS — Z09 Encounter for follow-up examination after completed treatment for conditions other than malignant neoplasm: Secondary | ICD-10-CM | POA: Diagnosis not present

## 2023-06-29 DIAGNOSIS — Z87438 Personal history of other diseases of male genital organs: Secondary | ICD-10-CM

## 2023-06-29 DIAGNOSIS — N452 Orchitis: Secondary | ICD-10-CM

## 2023-06-29 LAB — URINALYSIS, ROUTINE W REFLEX MICROSCOPIC
Bilirubin, UA: NEGATIVE
Glucose, UA: NEGATIVE
Ketones, UA: NEGATIVE
Leukocytes,UA: NEGATIVE
Nitrite, UA: NEGATIVE
Protein,UA: NEGATIVE
RBC, UA: NEGATIVE
Specific Gravity, UA: 1.02 (ref 1.005–1.030)
Urobilinogen, Ur: 0.2 mg/dL (ref 0.2–1.0)
pH, UA: 7 (ref 5.0–7.5)

## 2023-06-29 NOTE — Progress Notes (Signed)
   Assessment: 1. Orchitis; resolved     Plan: Return to office as needed.  Chief Complaint:  Chief Complaint  Patient presents with   Orchitis    History of Present Illness:  Jake Walsh is a 37 y.o. male who is seen for further evaluation of scrotal pain. He presented to the emergency room on 05/31/2023 with a 3 day history of scrotal pain and swelling. Urinalysis was unremarkable. Scrotal ultrasound demonstrated increased flow to both testicles with some parenchymal edema consistent with possible orchitis. GC and Chlamydia were negative. He was placed on doxycycline by his PCP on 06/01/23. Since starting the antibiotics, he noted a decrease in the scrotal swelling.  He continued to have some intermittent discomfort in both testicles. He was not having any significant voiding symptoms.  No dysuria or gross hematuria.  He returns today for follow-up.  He completed the antibiotics and the Naprosyn.  His symptoms are significantly improved.  He reports only occasional discomfort in the scrotum.  He is back at work.  No dysuria or gross hematuria.  Portions of the above documentation were copied from a prior visit for review purposes only.   Past Medical History:  Past Medical History:  Diagnosis Date   Anxiety    Coarse tremors     Past Surgical History:  Past Surgical History:  Procedure Laterality Date   DENTAL SURGERY      Allergies:  Allergies  Allergen Reactions   Capsaicin Swelling    Family History:  Family History  Problem Relation Age of Onset   Arthritis/Rheumatoid Mother    Gout Mother    Arthritis Mother    Parkinson's disease Father    Diabetes Father    Bipolar disorder Father    Gallstones Sister    Hodgkin's lymphoma Sister    ADD / ADHD Son    ODD Son     Social History:  Social History   Tobacco Use   Smoking status: Every Day    Current packs/day: 0.25    Types: Cigarettes   Smokeless tobacco: Never  Vaping Use   Vaping  status: Every Day   Substances: Nicotine  Substance Use Topics   Alcohol use: No    Comment: occ   Drug use: No    ROS: Constitutional:  Negative for fever, chills, weight loss CV: Negative for chest pain, previous MI, hypertension Respiratory:  Negative for shortness of breath, wheezing, sleep apnea, frequent cough GI:  Negative for nausea, vomiting, bloody stool, GERD  Physical exam: BP 129/85   Pulse 74   Ht 5\' 11"  (1.803 m)   Wt 214 lb (97.1 kg)   BMI 29.85 kg/m  GENERAL APPEARANCE:  Well appearing, well developed, well nourished, NAD HEENT:  Atraumatic, normocephalic, oropharynx clear NECK:  Supple without lymphadenopathy or thyromegaly ABDOMEN:  Soft, non-tender, no masses EXTREMITIES:  Moves all extremities well, without clubbing, cyanosis, or edema NEUROLOGIC:  Alert and oriented x 3, normal gait, CN II-XII grossly intact MENTAL STATUS:  appropriate BACK:  Non-tender to palpation, No CVAT SKIN:  Warm, dry, and intact GU:  no scrotal swelling or erythema; testicles NT, no masses  Results: U/A: Negative

## 2023-07-04 DIAGNOSIS — Z09 Encounter for follow-up examination after completed treatment for conditions other than malignant neoplasm: Secondary | ICD-10-CM | POA: Diagnosis not present

## 2023-07-04 DIAGNOSIS — K122 Cellulitis and abscess of mouth: Secondary | ICD-10-CM | POA: Diagnosis not present

## 2023-07-17 ENCOUNTER — Encounter: Payer: Self-pay | Admitting: Family Medicine

## 2023-07-17 DIAGNOSIS — F32A Depression, unspecified: Secondary | ICD-10-CM

## 2023-07-17 DIAGNOSIS — G479 Sleep disorder, unspecified: Secondary | ICD-10-CM

## 2023-07-17 DIAGNOSIS — R4589 Other symptoms and signs involving emotional state: Secondary | ICD-10-CM

## 2023-07-18 MED ORDER — HYDROXYZINE HCL 50 MG PO TABS
25.0000 mg | ORAL_TABLET | Freq: Every evening | ORAL | 0 refills | Status: AC | PRN
Start: 2023-07-18 — End: ?

## 2023-07-18 NOTE — Telephone Encounter (Signed)
Please see the MyChart message reply(ies) for my assessment and plan.   Patient and/or legal guardian verbally consented to [facility] services about presenting concerns and psychiatric consultation as appropriate.  The services will be billed as appropriate for the patient    This patient gave consent for this Medical Advice Message and is aware that it may result in a bill to [employer], as well as the possibility of receiving a bill for a co-payment or deductible. They are an established patient, but are not seeking medical advice exclusively about a problem treated during an in person or video visit in the last seven days. I did not recommend an in person or video visit within seven days of my reply.    I spent a total of 5 minutes cumulative time within 7 days through [employer].  Delynn Flavin, DO

## 2023-10-07 ENCOUNTER — Other Ambulatory Visit: Payer: Self-pay | Admitting: Family Medicine

## 2023-10-07 DIAGNOSIS — R531 Weakness: Secondary | ICD-10-CM

## 2023-10-07 DIAGNOSIS — G8929 Other chronic pain: Secondary | ICD-10-CM

## 2024-01-25 ENCOUNTER — Encounter: Payer: Self-pay | Admitting: Family Medicine

## 2024-01-25 ENCOUNTER — Ambulatory Visit: Admitting: Family Medicine

## 2024-01-25 ENCOUNTER — Ambulatory Visit: Payer: Self-pay

## 2024-01-25 ENCOUNTER — Ambulatory Visit (HOSPITAL_COMMUNITY)
Admission: RE | Admit: 2024-01-25 | Discharge: 2024-01-25 | Disposition: A | Discharge status: Home / Self Care | Source: Ambulatory Visit | Attending: Family Medicine | Admitting: Family Medicine

## 2024-01-25 VITALS — BP 131/80 | HR 68 | Temp 95.6°F | Ht 71.0 in | Wt 231.0 lb

## 2024-01-25 DIAGNOSIS — R0602 Shortness of breath: Secondary | ICD-10-CM | POA: Insufficient documentation

## 2024-01-25 DIAGNOSIS — R079 Chest pain, unspecified: Secondary | ICD-10-CM

## 2024-01-25 NOTE — Progress Notes (Signed)
 Subjective:  Patient ID: Jake Walsh, male    DOB: [birth year], 38 y.o.   MRN: [id number]  Patient Care Team: Raliegh Ip, DO as PCP - General (Family Medicine) Raliegh Ip, DO as Consulting Physician (Family Medicine)   Chief Complaint:  Shortness of Breath (Patient states he woke up at 1:30 am having trouble breathing and now he is having a little SOB. )   HPI: Jake Walsh is a 38 y.o. male presenting on 01/25/2024 for Shortness of Breath (Patient states he woke up at 1:30 am having trouble breathing and now he is having a little SOB. )   History of Present Illness   Jake Walsh "Annette Stable" is a 38 year old male who presents with shortness of breath and chest pain.  He experiences episodes of shortness of breath, notably waking up at 1:30 AM unable to breathe, which resolved after a few minutes. This was a new occurrence for him. He also experiences shortness of breath when walking around at work.  He describes chest pain located in the center of his stomach, towards the bottom of his chest. The pain is stabbing and constant, not worsened by deep breaths. No nausea, vomiting, or sweating accompany the pain.  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 He reports no problems since the tracheostomy was closed off.  No recent illnesses or exposure to sick individuals. No dizziness, palpitations, or unexplained fatigue, although he mentions burping occasionally.  Family history is significant for his father having cardiac problems and requiring nitroglycerin, although he does not have a close relationship with him.          Relevant past medical, surgical, family, and social history reviewed and updated as indicated.  Allergies and medications reviewed and updated. Data reviewed: Chart in Epic.   Past Medical History:  Diagnosis Date   Anxiety     Coarse tremors     Past Surgical History:  Procedure Laterality Date   DENTAL SURGERY      Social History   Socioeconomic History   Marital status: Married    Spouse name: Enrique Sack   Number of children: 3   Years of education: Not on file   Highest education level: GED or equivalent  Occupational History   Not on file  Tobacco Use   Smoking status: Every Day    Current packs/day: 0.25    Types: Cigarettes   Smokeless tobacco: Never  Vaping Use   Vaping status: Every Day   Substances: Nicotine  Substance and Sexual Activity   Alcohol use: No    Comment: occ   Drug use: No   Sexual activity: Yes    Birth control/protection: None  Other Topics Concern   Not on file  Social History Narrative   Are you right handed or left handed Right    Are you currently employed ? yes   What is your current occupation?[employer]   Do you live at home alone?no   Who lives with you? 5 kids and wife   What type of home do you live in: 1 story or 2 story? one    Caffeine 3 cups daily   Social Drivers of Health   Financial Resource Strain: Medium Risk (05/17/2023)   Overall Financial Resource Strain (CARDIA)    Difficulty of Paying Living Expenses: Somewhat hard  Food Insecurity:  No Food Insecurity (05/17/2023)   Hunger Vital Sign    Worried About Running Out of Food in the Last Year: Never true    Ran Out of Food in the Last Year: Never true  Transportation Needs: No Transportation Needs (05/17/2023)   PRAPARE - Administrator, Civil Service (Medical): No    Lack of Transportation (Non-Medical): No  Physical Activity: Unknown (05/17/2023)   Exercise Vital Sign    Days of Exercise per Week: 0 days    Minutes of Exercise per Session: Not on file  Stress: Stress Concern Present (05/17/2023)   [employer] of Occupational Health - Occupational Stress Questionnaire    Feeling of Stress : To some extent  Social Connections: Moderately Integrated (05/17/2023)   Social  Connection and Isolation Panel [NHANES]    Frequency of Communication with Friends and Family: More than three times a week    Frequency of Social Gatherings with Friends and Family: Once a week    Attends Religious Services: More than 4 times per year    Active Member of [employer] or Organizations: No    Attends Banker Meetings: Not on file    Marital Status: Married  Intimate Partner Violence: Not on file    Outpatient Encounter Medications as of 01/25/2024  Medication Sig   naproxen (NAPROSYN) 375 MG tablet Take 1 tablet (375 mg total) by mouth 2 (two) times daily.   propranolol (INDERAL) 10 MG tablet Take 1 tablet (10 mg total) by mouth 2 (two) times daily. For anxiety   QUEtiapine (SEROQUEL) 50 MG tablet Take 1 tablet (50 mg total) by mouth at bedtime.   gabapentin (NEURONTIN) 600 MG tablet TAKE 1/2 TO 1 (ONE-HALF TO ONE) TABLET BY MOUTH THREE TIMES DAILY AS NEEDED FOR PAIN **NEEDS  TO  BE  SEEN  BEFORE  NEXT  REFILL** (Patient not taking: Reported on 01/25/2024)   hydrOXYzine (ATARAX) 50 MG tablet Take 0.5-1 tablets (25-50 mg total) by mouth at bedtime as needed (sleep). (Patient not taking: Reported on 01/25/2024)   No facility-administered encounter medications on file as of 01/25/2024.    Allergies  Allergen Reactions   Capsaicin Swelling    Pertinent ROS per HPI, otherwise unremarkable      Objective:  BP 131/80   Pulse 68   Temp (!) 95.6 F (35.3 C)   Ht 5\' 11"  (1.803 m)   Wt 231 lb (104.8 kg)   SpO2 100%   BMI 32.22 kg/m    Wt Readings from Last 3 Encounters:  01/25/24 231 lb (104.8 kg)  06/29/23 214 lb (97.1 kg)  06/07/23 212 lb (96.2 kg)    Physical Exam Vitals and nursing note reviewed.  Constitutional:      General: He is not in acute distress.    Appearance: Normal appearance. He is well-developed and well-groomed. He is obese. He is not ill-appearing, toxic-appearing or diaphoretic.  HENT:     Head: Normocephalic and atraumatic.     Jaw:  There is normal jaw occlusion.     Right Ear: Hearing normal.     Left Ear: Hearing normal.     Nose: Nose normal.     Mouth/Throat:     Lips: Pink.     Mouth: Mucous membranes are moist.     Pharynx: Oropharynx is clear. Uvula midline.  Eyes:     General: Lids are normal.     Extraocular Movements: Extraocular movements intact.     Conjunctiva/sclera: Conjunctivae normal.  Pupils: Pupils are equal, round, and reactive to light.  Neck:     Thyroid: No thyroid mass, thyromegaly or thyroid tenderness.     Vascular: No carotid bruit or JVD.     Trachea: Trachea and phonation normal.  Cardiovascular:     Rate and Rhythm: Normal rate and regular rhythm.     Chest Wall: PMI is not displaced.     Pulses: Normal pulses.     Heart sounds: Normal heart sounds. No murmur heard.    No friction rub. No gallop.  Pulmonary:     Effort: Pulmonary effort is normal. No respiratory distress.     Breath sounds: Normal breath sounds. No decreased breath sounds or wheezing.  Abdominal:     General: Bowel sounds are normal. There is no distension or abdominal bruit.     Palpations: Abdomen is soft. There is no hepatomegaly or splenomegaly.     Tenderness: There is no abdominal tenderness. There is no right CVA tenderness or left CVA tenderness.     Hernia: No hernia is present.  Musculoskeletal:        General: Normal range of motion.     Cervical back: Normal range of motion and neck supple.     Right lower leg: No edema.     Left lower leg: No edema.  Lymphadenopathy:     Cervical: No cervical adenopathy.  Skin:    General: Skin is warm and dry.     Capillary Refill: Capillary refill takes less than 2 seconds.     Coloration: Skin is not cyanotic, jaundiced or pale.     Findings: No rash.  Neurological:     General: No focal deficit present.     Mental Status: He is alert and oriented to person, place, and time.     Sensory: Sensation is intact.     Motor: Motor function is intact.      Coordination: Coordination is intact.     Gait: Gait is intact.     Deep Tendon Reflexes: Reflexes are normal and symmetric.  Psychiatric:        Attention and Perception: Attention and perception normal.        Mood and Affect: Affect normal. Mood is anxious.        Speech: Speech normal.        Behavior: Behavior normal. Behavior is cooperative.        Thought Content: Thought content normal.        Cognition and Memory: Cognition and memory normal.        Judgment: Judgment normal.     Results for orders placed or performed in visit on 01/25/24  D-dimer, quantitative   Collection Time: 01/25/24 11:13 AM  Result Value Ref Range   D-DIMER 0.33 0.00 - 0.49 mg/L FEU  CK isoenzymes (brain, muscle injury)   Collection Time: 01/25/24 11:13 AM  Result Value Ref Range   Total CK 69 49 - 439 U/L   Macro Type 2 WILL FOLLOW    CK-MM WILL FOLLOW    Macro Type 1 WILL FOLLOW    CK-MB WILL FOLLOW    CK-BB WILL FOLLOW   CBC with Differential   Collection Time: 01/25/24 11:13 AM  Result Value Ref Range   WBC 8.0 3.4 - 10.8 x10E3/uL   RBC 5.10 4.14 - 5.80 x10E6/uL   Hemoglobin 15.0 13.0 - 17.7 g/dL   Hematocrit 16.1 09.6 - 51.0 %   MCV 84 79 - 97 fL  MCH 29.4 26.6 - 33.0 pg   MCHC 34.9 31.5 - 35.7 g/dL   RDW 78.2 95.6 - 21.3 %   Platelets 242 150 - 450 x10E3/uL   Neutrophils 53 Not Estab. %   Lymphs 34 Not Estab. %   Monocytes 7 Not Estab. %   Eos 5 Not Estab. %   Basos 1 Not Estab. %   Neutrophils Absolute 4.3 1.4 - 7.0 x10E3/uL   Lymphocytes Absolute 2.7 0.7 - 3.1 x10E3/uL   Monocytes Absolute 0.6 0.1 - 0.9 x10E3/uL   EOS (ABSOLUTE) 0.4 0.0 - 0.4 x10E3/uL   Basophils Absolute 0.0 0.0 - 0.2 x10E3/uL   Immature Granulocytes 0 Not Estab. %   Immature Grans (Abs) 0.0 0.0 - 0.1 x10E3/uL  CMP14+EGFR   Collection Time: 01/25/24 11:13 AM  Result Value Ref Range   Glucose 82 70 - 99 mg/dL   BUN 11 6 - 20 mg/dL   Creatinine, Ser 0.86 0.76 - 1.27 mg/dL   eGFR 578 >46 NG/EXB/2.84    BUN/Creatinine Ratio 14 9 - 20   Sodium 140 134 - 144 mmol/L   Potassium 4.3 3.5 - 5.2 mmol/L   Chloride 101 96 - 106 mmol/L   CO2 25 20 - 29 mmol/L   Calcium 9.6 8.7 - 10.2 mg/dL   Total Protein 6.9 6.0 - 8.5 g/dL   Albumin 4.7 4.1 - 5.1 g/dL   Globulin, Total 2.2 1.5 - 4.5 g/dL   Bilirubin Total <1.3 0.0 - 1.2 mg/dL   Alkaline Phosphatase 81 44 - 121 IU/L   AST 17 0 - 40 IU/L   ALT 20 0 - 44 IU/L     EKG:SR, 68, PR 164 ms, QT 422 ms, no acute ST-T changes, BBB. No significant changes from prior EKG.   X-Ray: CXR: No acute findings. Preliminary x-ray reading by Kattie Parrot, FNP-C, [facility].   Pertinent labs & imaging results that were available during my care of the patient were reviewed by me and considered in my medical decision making.  Assessment & Plan:  Jake Walsh" was seen today for shortness of breath.  Diagnoses and all orders for this visit:  Shortness of breath -     D-dimer, quantitative -     CK isoenzymes (brain, muscle injury) -     CBC with Differential -     CMP14+EGFR -     EKG 12-Lead -     DG Chest 2 View; Future -     Cancel: Troponin T, STAT ([facility])  Chest pain in adult -     D-dimer, quantitative -     CK isoenzymes (brain, muscle injury) -     CBC with Differential -     CMP14+EGFR -     EKG 12-Lead -     DG Chest 2 View; Future -     Cancel: Troponin T, STAT ([facility])     Assessment and Plan    Exertional Dyspnea and Chest Pain He experienced sudden onset of dyspnea at 1:30 AM, followed by exertional dyspnea and constant stabbing chest pain located centrally and towards the bottom of the chest. The pain is not exacerbated by deep breathing and is not associated with nausea, vomiting, or sweating. There is a family history of cardiac issues. Differential diagnosis includes cardiac-related issues, pulmonary embolism, and anxiety. Initial EKG showed no acute changes, and D-dimer was negative, ruling out pulmonary embolism. Chest x-ray showed  no acute findings. The symptoms are likely anxiety-driven. Medical decision making involved  ruling out serious conditions such as cardiac issues and pulmonary embolism through EKG, blood work, and chest x-ray. Informed consent included discussing the need for further evaluation if new or recurrent symptoms occur. - Advise follow-up if new or recurrent symptoms occur          Continue all other maintenance medications.  Follow up plan: Return if symptoms worsen or fail to improve.   Continue healthy lifestyle choices, including diet (rich in fruits, vegetables, and lean proteins, and low in salt and simple carbohydrates) and exercise (at least 30 minutes of moderate physical activity daily).   The above assessment and management plan was discussed with the patient. The patient verbalized understanding of and has agreed to the management plan. Patient is aware to call the clinic if they develop any new symptoms or if symptoms persist or worsen. Patient is aware when to return to the clinic for a follow-up visit. Patient educated on when it is appropriate to go to the emergency department.   Kattie Parrot, FNP-C [facility] [phone]

## 2024-01-25 NOTE — Telephone Encounter (Signed)
 Apt scheduled.

## 2024-01-25 NOTE — Telephone Encounter (Signed)
  Chief Complaint: shortness of breath Symptoms: shortness of breath, mild chest pain  Frequency: this morning Pertinent Negatives: Patient denies fever, dizziness, cough Disposition: [] ED /[] Urgent Care (no appt availability in office) / [x] Appointment(In office/virtual)/ []  [place] [facility]/ [] Home Care/ [] Refused Recommended Disposition /[] [facility]/ []  Follow-up with PCP Additional Notes: Patient reports he was woken out of his sleep this morning feeling like he couldn't breath for a few seconds. Patient was eventually able to catch his breath and go back to sleep. Patient reports he is now feeling mild sob when he is up and walking around and mild chest pain at the center of his chest towards the bottom. Patient states he has had issues with anxiety attacks in the past but states that this feels different. Per protocol, appt scheduled today 01/25/24. Patient advised to call back with worsening symptoms. Patient verbalized understanding.     Copied from CRM [id number]. Topic: Clinical - Red Word Triage >> Jan 25, 2024  9:40 AM Geroge Baseman wrote: Red Word that prompted transfer to Nurse Triage: Woke up in the middle of the night a could not breathe, short on breath today, exhausted and chest hurting. Reason for Disposition  [1] MILD difficulty breathing (e.g., minimal/no SOB at rest, SOB with walking, pulse <100) AND [2] NEW-onset or WORSE than normal  Answer Assessment - Initial Assessment Questions 1. RESPIRATORY STATUS: "Describe your breathing?" (e.g., wheezing, shortness of breath, unable to speak, severe coughing)      Shortness of breath 2. ONSET: "When did this breathing problem begin?"      This morning 3. PATTERN "Does the difficult breathing come and go, or has it been constant since it started?"      intermitent 4. SEVERITY: "How bad is your breathing?" (e.g., mild, moderate, severe)    - MILD: No SOB at rest, mild SOB with walking, speaks normally in sentences,  can lie down, no retractions, pulse < 100.    - MODERATE: SOB at rest, SOB with minimal exertion and prefers to sit, cannot lie down flat, speaks in phrases, mild retractions, audible wheezing, pulse 100-120.    - SEVERE: Very SOB at rest, speaks in single words, struggling to breathe, sitting hunched forward, retractions, pulse > 120      mild 5. RECURRENT SYMPTOM: "Have you had difficulty breathing before?" If Yes, ask: "When was the last time?" and "What happened that time?"      denies 6. CARDIAC HISTORY: "Do you have any history of heart disease?" (e.g., heart attack, angina, bypass surgery, angioplasty)      denies 7. LUNG HISTORY: "Do you have any history of lung disease?"  (e.g., pulmonary embolus, asthma, emphysema)     denies 8. CAUSE: "What do you think is causing the breathing problem?"      unsure 9. OTHER SYMPTOMS: "Do you have any other symptoms? (e.g., dizziness, runny nose, cough, chest pain, fever)     Chest pain 10. O2 SATURATION MONITOR:  "Do you use an oxygen saturation monitor (pulse oximeter) at home?" If Yes, ask: "What is your reading (oxygen level) today?" "What is your usual oxygen saturation reading?" (e.g., 95%)       unsure  Protocols used: Breathing Difficulty-A-AH

## 2024-01-27 LAB — CK ISOENZYMES
CK-BB: 0 %
CK-MB: 0 % (ref 0–3)
CK-MM: 100 % (ref 97–100)
Macro Type 1: 0 %
Macro Type 2: 0 %
Total CK: 69 U/L (ref 49–439)

## 2024-01-27 LAB — CBC WITH DIFFERENTIAL/PLATELET
Basophils Absolute: 0 10*3/uL (ref 0.0–0.2)
Basos: 1 %
EOS (ABSOLUTE): 0.4 10*3/uL (ref 0.0–0.4)
Eos: 5 %
Hematocrit: 43 % (ref 37.5–51.0)
Hemoglobin: 15 g/dL (ref 13.0–17.7)
Immature Grans (Abs): 0 10*3/uL (ref 0.0–0.1)
Immature Granulocytes: 0 %
Lymphocytes Absolute: 2.7 10*3/uL (ref 0.7–3.1)
Lymphs: 34 %
MCH: 29.4 pg (ref 26.6–33.0)
MCHC: 34.9 g/dL (ref 31.5–35.7)
MCV: 84 fL (ref 79–97)
Monocytes Absolute: 0.6 10*3/uL (ref 0.1–0.9)
Monocytes: 7 %
Neutrophils Absolute: 4.3 10*3/uL (ref 1.4–7.0)
Neutrophils: 53 %
Platelets: 242 10*3/uL (ref 150–450)
RBC: 5.1 x10E6/uL (ref 4.14–5.80)
RDW: 13.4 % (ref 11.6–15.4)
WBC: 8 10*3/uL (ref 3.4–10.8)

## 2024-01-27 LAB — CMP14+EGFR
ALT: 20 IU/L (ref 0–44)
AST: 17 IU/L (ref 0–40)
Albumin: 4.7 g/dL (ref 4.1–5.1)
Alkaline Phosphatase: 81 IU/L (ref 44–121)
BUN/Creatinine Ratio: 14 (ref 9–20)
BUN: 11 mg/dL (ref 6–20)
Bilirubin Total: <0.2 mg/dL (ref 0.0–1.2)
CO2: 25 mmol/L (ref 20–29)
Calcium: 9.6 mg/dL (ref 8.7–10.2)
Chloride: 101 mmol/L (ref 96–106)
Creatinine, Ser: 0.78 mg/dL (ref 0.76–1.27)
Globulin, Total: 2.2 g/dL (ref 1.5–4.5)
Glucose: 82 mg/dL (ref 70–99)
Potassium: 4.3 mmol/L (ref 3.5–5.2)
Sodium: 140 mmol/L (ref 134–144)
Total Protein: 6.9 g/dL (ref 6.0–8.5)
eGFR: 119 mL/min/{1.73_m2} (ref >59)

## 2024-01-27 LAB — D-DIMER, QUANTITATIVE: D-DIMER: 0.33 mg{FEU}/L (ref 0.00–0.49)

## 2024-02-07 ENCOUNTER — Encounter: Payer: Self-pay | Admitting: Family Medicine

## 2024-02-28 DIAGNOSIS — R200 Anesthesia of skin: Secondary | ICD-10-CM | POA: Diagnosis not present

## 2024-02-28 DIAGNOSIS — R002 Palpitations: Secondary | ICD-10-CM | POA: Diagnosis not present

## 2024-02-28 DIAGNOSIS — H00014 Hordeolum externum left upper eyelid: Secondary | ICD-10-CM | POA: Diagnosis not present

## 2024-02-28 DIAGNOSIS — F17210 Nicotine dependence, cigarettes, uncomplicated: Secondary | ICD-10-CM | POA: Diagnosis not present

## 2024-02-28 DIAGNOSIS — H02846 Edema of left eye, unspecified eyelid: Secondary | ICD-10-CM | POA: Diagnosis not present

## 2024-02-28 DIAGNOSIS — I499 Cardiac arrhythmia, unspecified: Secondary | ICD-10-CM | POA: Diagnosis not present

## 2024-03-26 ENCOUNTER — Encounter: Payer: Self-pay | Admitting: Nurse Practitioner

## 2024-03-26 ENCOUNTER — Encounter: Payer: Self-pay | Admitting: Family Medicine

## 2024-03-26 ENCOUNTER — Ambulatory Visit: Admitting: Nurse Practitioner

## 2024-03-26 VITALS — BP 139/84 | HR 66 | Temp 97.5°F | Ht 71.0 in | Wt 224.0 lb

## 2024-03-26 DIAGNOSIS — E66811 Obesity, class 1: Secondary | ICD-10-CM | POA: Diagnosis not present

## 2024-03-26 DIAGNOSIS — Z6831 Body mass index (BMI) 31.0-31.9, adult: Secondary | ICD-10-CM | POA: Insufficient documentation

## 2024-03-26 DIAGNOSIS — I10 Essential (primary) hypertension: Secondary | ICD-10-CM | POA: Insufficient documentation

## 2024-03-26 MED ORDER — LISINOPRIL 10 MG PO TABS: 10.0000 mg | ORAL_TABLET | Freq: Every day | ORAL | 0 refills | Status: DC

## 2024-03-26 NOTE — Progress Notes (Signed)
 Acute Office Visit  Subjective:     Patient ID: Jake Walsh, male    DOB: 25-Jun-1986, 38 y.o.   MRN: [id number]  Chief Complaint  Patient presents with   Hypertension    Hypertension for past week    HPI Jake Walsh is a 38 yrs old male male presents 03/26/2024 for an acute visit concerns for high BP. He doesn't have a dx of HTN Hypertension: Patient is here for evaluation of elevated blood pressures.  Age at onset of elevated blood pressure:  37 yrs.Cardiac symptoms fatigue. Patient denies chest pain, chest pressure/discomfort, dyspnea, irregular heart beat, lower extremity edema, palpitations, and syncope.  Cardiovascular risk factors: vape nicotine. Use of agents associated with hypertension: none. History of target organ damage: none.   Reports that he felt off, chest pain last Sunday and my wife told me check my BP  He has been using a wrist cuff  BP has been  198/95, Monday 168/107. Tuesday 147/97, wed 144/99, Thursday 136/81, fri 159/101, sat 113/93, sun 147/87 and three different reading from this morning 03/26/24 158/112, 179/108 164/110, taking within 15 mins interval. Denies chest pain, HA. Diet consist of mostly junk food Reports family history of HTN  Obesity The patient is a 38 year old male with a BMI of 31.24 (classified as obese), presenting for evaluation of general health and lifestyle concerns. He reports a diet predominantly consisting of junk food, especially when he is busy with work. He states, When I'm at work and busy, I just eat whatever I can find--Pringles, soft drinks. He denies regular meal planning or healthy eating habits and often skips meals or substitutes them with processed snacks. He does not report any specific gastrointestinal symptoms but acknowledges low energy levels and occasional bloating. No current physical activity routine. No recent weight loss or gain noted, though he is concerned about his current lifestyle and its long-term impact on  his BP Active Ambulatory Problems    Diagnosis Date Noted   Hypertension 03/26/2024   Class 1 obesity with body mass index (BMI) of 31.0 to 31.9 in adult 03/26/2024   Resolved Ambulatory Problems    Diagnosis Date Noted   Acute pain of left knee 11/07/2020   Annual physical exam 02/06/2021   Nicotine dependence, uncomplicated 02/06/2021   Encounter for vasectomy counseling 08/12/2021   Ear pain 10/14/2021   Orchitis 06/07/2023   Past Medical History:  Diagnosis Date   Anxiety    Coarse tremors     Review of Systems  Constitutional:  Negative for chills and fever.  HENT:  Negative for ear pain and sore throat.   Eyes:  Negative for blurred vision, double vision and pain.  Respiratory:  Negative for cough and shortness of breath.   Cardiovascular:  Negative for chest pain and leg swelling.  Gastrointestinal:  Negative for nausea and vomiting.  Skin:  Negative for itching and rash.  Neurological:  Negative for dizziness and headaches.   Negative unless indicated in HPI    Objective:    BP 139/84   Pulse 66   Temp (!) 97.5 F (36.4 C) (Temporal)   Ht 5' 11 (1.803 m)   Wt 224 lb (101.6 kg)   SpO2 100%   BMI 31.24 kg/m  BP Readings from Last 3 Encounters:  03/26/24 139/84  01/25/24 131/80  06/29/23 129/85   Wt Readings from Last 3 Encounters:  03/26/24 224 lb (101.6 kg)  01/25/24 231 lb (104.8 kg)  06/29/23 214 lb (97.1 kg)  Physical Exam Vitals and nursing note reviewed.  Constitutional:      Appearance: He is obese.  HENT:     Head: Normocephalic and atraumatic.     Nose: Nose normal.   Eyes:     General: No scleral icterus.    Extraocular Movements: Extraocular movements intact.     Conjunctiva/sclera: Conjunctivae normal.     Pupils: Pupils are equal, round, and reactive to light.    Cardiovascular:     Heart sounds: Normal heart sounds.  Pulmonary:     Effort: Pulmonary effort is normal.     Breath sounds: Normal breath sounds.    Musculoskeletal:        General: Normal range of motion.     Right lower leg: No edema.     Left lower leg: No edema.   Skin:    General: Skin is warm and dry.     Findings: No rash.   Neurological:     Mental Status: He is alert and oriented to person, place, and time.     Gait: Gait is intact.   Psychiatric:        Mood and Affect: Mood normal.        Behavior: Behavior normal.        Thought Content: Thought content normal.        Judgment: Judgment normal.     No results found for any visits on 03/26/24.      Assessment & Plan:  Hypertension, unspecified type -     Lisinopril; Take 1 tablet (10 mg total) by mouth daily.  Dispense: 90 tablet; Refill: 0  Class 1 obesity with body mass index (BMI) of 31.0 to 31.9 in adult, unspecified obesity type, unspecified whether serious comorbidity present   Jake Walsh is a 38 yrs old Caucasian male seen today fro HTN Will start Lisinopril 10 mg daily, BP log provided, he is instructed to check BP BID  Nurse Visit 2 weeks, bring BP log and home BP cuff  Goal BP is 130/80. Pt aware to report any persistent high or low readings. DASH diet and exercise encouraged. Exercise at least 150 minutes per week and increase as tolerated. Goal BMI > 25. Stress management encouraged. Avoid nicotine and tobacco product use. Avoid excessive alcohol and NSAID's. Avoid more than 2000 mg of sodium daily. Medications as prescribed. Follow up as scheduled.   Obese: diet and exercise Continue healthy lifestyle choices, including diet (rich in fruits, vegetables, and lean proteins, and low in salt and simple carbohydrates) and exercise (at least 30 minutes of moderate physical activity daily).     The above assessment and management plan was discussed with the patient. The patient verbalized understanding of and has agreed to the management plan. Patient is aware to call the clinic if they develop any new symptoms or if symptoms persist or worsen. Patient is  aware when to return to the clinic for a follow-up visit. Patient educated on when it is appropriate to go to the emergency department.  Return in about 6 weeks (around 05/07/2024) for with PCP.  Elita Dame [place] Thompson, DNP [facility] [address] [place], [place] [address] [phone]  Note: This document was prepared by [employer] voice dictation technology and any errors that results from this process are unintentional.

## 2024-04-09 ENCOUNTER — Ambulatory Visit (INDEPENDENT_AMBULATORY_CARE_PROVIDER_SITE_OTHER)

## 2024-04-09 ENCOUNTER — Encounter: Payer: Self-pay | Admitting: Family Medicine

## 2024-04-09 NOTE — Progress Notes (Signed)
 Patient is in office today for a nurse visit for Blood Pressure Check. Patient blood pressure was 110/78, Patient No chest pain. Patient provided blood pressure log. Blood pressure log given to Sandra.

## 2024-05-07 ENCOUNTER — Encounter: Payer: Self-pay | Admitting: Family Medicine

## 2024-05-07 ENCOUNTER — Ambulatory Visit: Payer: Self-pay | Admitting: Family Medicine

## 2024-05-07 ENCOUNTER — Ambulatory Visit: Admitting: Family Medicine

## 2024-05-07 VITALS — BP 122/69 | HR 91 | Temp 98.3°F | Ht 71.0 in | Wt 227.0 lb

## 2024-05-07 DIAGNOSIS — I10 Essential (primary) hypertension: Secondary | ICD-10-CM

## 2024-05-07 DIAGNOSIS — F439 Reaction to severe stress, unspecified: Secondary | ICD-10-CM

## 2024-05-07 DIAGNOSIS — R339 Retention of urine, unspecified: Secondary | ICD-10-CM | POA: Diagnosis not present

## 2024-05-07 LAB — URINALYSIS, ROUTINE W REFLEX MICROSCOPIC
Bilirubin, UA: NEGATIVE
Glucose, UA: NEGATIVE
Ketones, UA: NEGATIVE
Leukocytes,UA: NEGATIVE
Nitrite, UA: NEGATIVE
Protein,UA: NEGATIVE
RBC, UA: NEGATIVE
Specific Gravity, UA: <1.005 — ABNORMAL LOW (ref 1.005–1.030)
Urobilinogen, Ur: 0.2 mg/dL (ref 0.2–1.0)
pH, UA: 6 (ref 5.0–7.5)

## 2024-05-07 MED ORDER — AMLODIPINE BESYLATE 2.5 MG PO TABS
2.5000 mg | ORAL_TABLET | Freq: Every day | ORAL | 0 refills | Status: DC
Start: 2024-05-07 — End: 2024-06-01

## 2024-05-07 NOTE — Progress Notes (Signed)
 Subjective: CC:HTN PCP: Jolinda Norene HERO, DO Jake Walsh is a 38 y.o. male presenting to clinic today for:  1. HTN Started on lisinopril  10 mg daily and instructed to pursue lifestyle modifications for DASH diet etc. 6 weeks ago by one of my partners.  He presents today for follow-up and notes that over the last few weeks he has been having increasing urinary urgency, frequency and bladder pressure.  He denies any dysuria, hematuria.  He does feel like he is urinating without difficulty but is not sure that he is emptying his bladder totally.  He denies any fevers.  He is sexually active but only with his wife and denies any genital symptoms otherwise.  No flank pain other than some normal back pain from pushing and pulling things that he should not have.  He has not changed how much he is drinking or what he is eating.  2.  Stress at home He reports he has been under a little bit more stress.  They are in the middle of selling his home and he notes that his wife recently changed jobs and he worries about her.  ROS: Per HPI  Allergies  Allergen Reactions   Capsaicin Swelling   Past Medical History:  Diagnosis Date   Anxiety    Coarse tremors     Current Outpatient Medications:    lisinopril  (ZESTRIL ) 10 MG tablet, Take 1 tablet (10 mg total) by mouth daily., Disp: 90 tablet, Rfl: 0   naproxen  (NAPROSYN ) 375 MG tablet, Take 1 tablet (375 mg total) by mouth 2 (two) times daily., Disp: 20 tablet, Rfl: 0   propranolol  (INDERAL ) 10 MG tablet, Take 1 tablet (10 mg total) by mouth 2 (two) times daily. For anxiety, Disp: 180 tablet, Rfl: 3   QUEtiapine  (SEROQUEL ) 50 MG tablet, Take 1 tablet (50 mg total) by mouth at bedtime., Disp: 90 tablet, Rfl: 3 Social History   Socioeconomic History   Marital status: Married    Spouse name: Marjorie   Number of children: 3   Years of education: Not on file   Highest education level: GED or equivalent  Occupational History   Not on file   Tobacco Use   Smoking status: Every Day    Current packs/day: 0.25    Types: Cigarettes   Smokeless tobacco: Never  Vaping Use   Vaping status: Every Day   Substances: Nicotine  Substance and Sexual Activity   Alcohol use: No    Comment: occ   Drug use: No   Sexual activity: Yes    Birth control/protection: None  Other Topics Concern   Not on file  Social History Narrative   Are you right handed or left handed Right    Are you currently employed ? yes   What is your current occupation?[employer]   Do you live at home alone?no   Who lives with you? 5 kids and wife   What type of home do you live in: 1 story or 2 story? one    Caffeine 3 cups daily   Social Drivers of Health   Financial Resource Strain: Medium Risk (05/17/2023)   Overall Financial Resource Strain (CARDIA)    Difficulty of Paying Living Expenses: Somewhat hard  Food Insecurity: No Food Insecurity (05/17/2023)   Hunger Vital Sign    Worried About Running Out of Food in the Last Year: Never true    Ran Out of Food in the Last Year: Never true  Transportation Needs:  No Transportation Needs (05/17/2023)   PRAPARE - Administrator, Civil Service (Medical): No    Lack of Transportation (Non-Medical): No  Physical Activity: Unknown (05/17/2023)   Exercise Vital Sign    Days of Exercise per Week: 0 days    Minutes of Exercise per Session: Not on file  Stress: Stress Concern Present (05/17/2023)   [employer] of Occupational Health - Occupational Stress Questionnaire    Feeling of Stress : To some extent  Social Connections: Moderately Integrated (05/17/2023)   Social Connection and Isolation Panel    Frequency of Communication with Friends and Family: More than three times a week    Frequency of Social Gatherings with Friends and Family: Once a week    Attends Religious Services: More than 4 times per year    Active Member of [employer] or Organizations: No    Attends Engineer, structural: Not  on file    Marital Status: Married  Catering manager Violence: Not on file   Family History  Problem Relation Age of Onset   Arthritis/Rheumatoid Mother    Gout Mother    Arthritis Mother    Parkinson's disease Father    Diabetes Father    Bipolar disorder Father    Gallstones Sister    Hodgkin's lymphoma Sister    ADD / ADHD Son    ODD Son     Objective: Office vital signs reviewed. BP 122/69   Pulse 91   Temp 98.3 F (36.8 C)   Ht 5' 11 (1.803 m)   Wt 227 lb (103 kg)   SpO2 97%   BMI 31.66 kg/m   Physical Examination:  General: Awake, alert, well nourished, No acute distress HEENT: Sclera white.  Moist mucous membranes GU: Patient does have some mild suprapubic tenderness to palpation.  Bladder does not feel distended.  He was tender over the left flank.     05/07/2024    9:35 AM 01/25/2024   10:57 AM 04/22/2023    3:10 PM  Depression screen PHQ 2/9  Decreased Interest 1 1 1   Down, Depressed, Hopeless 1 1 1   PHQ - 2 Score 2 2 2   Altered sleeping 3 1 2   Tired, decreased energy 2 2 3   Change in appetite 1 1 2   Feeling bad or failure about yourself  1 0 3  Trouble concentrating 2 1 3   Moving slowly or fidgety/restless 1 1 2   Suicidal thoughts 0 0 1  PHQ-9 Score 12 8 18   Difficult doing work/chores Somewhat difficult Not difficult at all Somewhat difficult      05/07/2024    9:35 AM 01/25/2024   10:58 AM 05/17/2023   11:18 AM 04/22/2023    3:10 PM  GAD 7 : Generalized Anxiety Score  Nervous, Anxious, on Edge 1 1 2 2   Control/stop worrying 2 1 2 2   Worry too much - different things 2 1 2 2   Trouble relaxing 2 1 2 2   Restless 2 1 2 1   Easily annoyed or irritable 1 2 2 3   Afraid - awful might happen 0 0 1 2  Total GAD 7 Score 10 7 13 14   Anxiety Difficulty Somewhat difficult Not difficult at all Somewhat difficult Somewhat difficult      Assessment/ Plan: 38 y.o. male   Essential hypertension - Plan: Basic Metabolic Panel, amLODipine  (NORVASC ) 2.5 MG  tablet  Urinary retention - Plan: Urine Culture, Urinalysis, Routine w reflex microscopic, PSA, CBC with Differential  Stress  at home.  Blood pressure is well-controlled with lisinopril  but I do question if maybe this is leading to some of his urinary symptoms as his urinalysis was totally normal.  I am checking PSA, CBC and I am going to go ahead and send this for urine culture.  In the meantime, lets switch his blood pressure medicine to amlodipine  and see if this may be resolved some of his issues  He is having some stresses at home due to some changes.  He will contact me if symptoms are persistent we can consider medication adjustment but at this time it seems to be situational   Norene CHRISTELLA Fielding, DO [facility] [phone]

## 2024-05-07 NOTE — Patient Instructions (Signed)
 Urinalysis did not show any evidence of infection.  I am going to go ahead and send this for culture and we will get some labs to ensure that were not dealing with a prostate infection or any kidney injury from starting the new lisinopril .  I would like you to hold off on utilizing your lisinopril  and we are going to replace that with amlodipine  for the next few days to see if the symptoms get better.  If they do not or if you develop any other concerning symptoms or signs I want you to get seen by your urologist immediately

## 2024-05-09 LAB — URINE CULTURE: Organism ID, Bacteria: NO GROWTH

## 2024-05-16 ENCOUNTER — Encounter: Payer: Self-pay | Admitting: Family Medicine

## 2024-05-16 ENCOUNTER — Other Ambulatory Visit

## 2024-05-16 ENCOUNTER — Other Ambulatory Visit: Payer: Self-pay | Admitting: Family Medicine

## 2024-05-16 DIAGNOSIS — R3589 Other polyuria: Secondary | ICD-10-CM

## 2024-05-16 DIAGNOSIS — R7309 Other abnormal glucose: Secondary | ICD-10-CM | POA: Diagnosis not present

## 2024-05-16 LAB — BAYER DCA HB A1C WAIVED: HB A1C (BAYER DCA - WAIVED): 5.3 % (ref 4.8–5.6)

## 2024-05-17 ENCOUNTER — Ambulatory Visit: Payer: Self-pay | Admitting: Family Medicine

## 2024-05-17 LAB — CBC WITH DIFFERENTIAL/PLATELET
Basophils Absolute: 0 x10E3/uL (ref 0.0–0.2)
Basos: 1 %
EOS (ABSOLUTE): 0.2 x10E3/uL (ref 0.0–0.4)
Eos: 4 %
Hematocrit: 41.3 % (ref 37.5–51.0)
Hemoglobin: 13.6 g/dL (ref 13.0–17.7)
Immature Grans (Abs): 0 x10E3/uL (ref 0.0–0.1)
Immature Granulocytes: 0 %
Lymphocytes Absolute: 2.4 x10E3/uL (ref 0.7–3.1)
Lymphs: 39 %
MCH: 28.8 pg (ref 26.6–33.0)
MCHC: 32.9 g/dL (ref 31.5–35.7)
MCV: 87 fL (ref 79–97)
Monocytes Absolute: 0.5 x10E3/uL (ref 0.1–0.9)
Monocytes: 8 %
Neutrophils Absolute: 3 x10E3/uL (ref 1.4–7.0)
Neutrophils: 48 %
Platelets: 216 x10E3/uL (ref 150–450)
RBC: 4.73 x10E6/uL (ref 4.14–5.80)
RDW: 13.2 % (ref 11.6–15.4)
WBC: 6.2 x10E3/uL (ref 3.4–10.8)

## 2024-05-17 LAB — CMP14+EGFR
ALT: 22 IU/L (ref 0–44)
AST: 17 IU/L (ref 0–40)
Albumin: 4.4 g/dL (ref 4.1–5.1)
Alkaline Phosphatase: 68 IU/L (ref 44–121)
BUN/Creatinine Ratio: 13 (ref 9–20)
BUN: 10 mg/dL (ref 6–20)
Bilirubin Total: 0.2 mg/dL (ref 0.0–1.2)
CO2: 21 mmol/L (ref 20–29)
Calcium: 9.5 mg/dL (ref 8.7–10.2)
Chloride: 101 mmol/L (ref 96–106)
Creatinine, Ser: 0.78 mg/dL (ref 0.76–1.27)
Globulin, Total: 2.1 g/dL (ref 1.5–4.5)
Glucose: 109 mg/dL — ABNORMAL HIGH (ref 70–99)
Potassium: 4 mmol/L (ref 3.5–5.2)
Sodium: 138 mmol/L (ref 134–144)
Total Protein: 6.5 g/dL (ref 6.0–8.5)
eGFR: 119 mL/min/1.73 (ref >59)

## 2024-05-17 LAB — PSA: Prostate Specific Ag, Serum: 0.4 ng/mL (ref 0.0–4.0)

## 2024-05-31 ENCOUNTER — Other Ambulatory Visit: Payer: Self-pay | Admitting: Family Medicine

## 2024-05-31 DIAGNOSIS — I10 Essential (primary) hypertension: Secondary | ICD-10-CM

## 2024-06-19 ENCOUNTER — Encounter: Payer: Self-pay | Admitting: Urology

## 2024-06-19 ENCOUNTER — Ambulatory Visit (INDEPENDENT_AMBULATORY_CARE_PROVIDER_SITE_OTHER): Admitting: Urology

## 2024-06-19 VITALS — BP 131/88 | HR 78 | Ht 70.0 in | Wt 232.0 lb

## 2024-06-19 DIAGNOSIS — R399 Unspecified symptoms and signs involving the genitourinary system: Secondary | ICD-10-CM | POA: Diagnosis not present

## 2024-06-19 LAB — URINALYSIS, ROUTINE W REFLEX MICROSCOPIC
Bilirubin, UA: NEGATIVE
Glucose, UA: NEGATIVE
Ketones, UA: NEGATIVE
Leukocytes,UA: NEGATIVE
Nitrite, UA: NEGATIVE
Protein,UA: NEGATIVE
RBC, UA: NEGATIVE
Specific Gravity, UA: 1.01 (ref 1.005–1.030)
Urobilinogen, Ur: 0.2 mg/dL (ref 0.2–1.0)
pH, UA: 6 (ref 5.0–7.5)

## 2024-06-19 LAB — BLADDER SCAN AMB NON-IMAGING

## 2024-06-19 MED ORDER — GEMTESA 75 MG PO TABS: 75.0000 mg | ORAL_TABLET | Freq: Every day | ORAL | Status: DC

## 2024-06-19 NOTE — Progress Notes (Signed)
   Assessment: 1. Lower urinary tract symptoms (LUTS)     Plan: I personally reviewed the patient's chart including provider notes, and lab results. Bladder diet sheet provided Trial of Gemtesa  75 mg daily.  Samples given. Return to office in 1 month  Chief Complaint:  Chief Complaint  Patient presents with   LUTS    History of Present Illness:  Jake Walsh is a 38 y.o. male who is seen for evaluation of lower urinary tract symptoms. He has had increased low urinary tract symptoms for approximately 2 months.  He reports increased daytime frequency voiding 10-20 times per day and nocturia 2-3 times.  He has bladder pressure prior to voiding.  He has a sensation of incomplete emptying.  He voids with a good stream.  No dysuria or gross hematuria. IPSS = 15/4.  PSA 05/16/2024: 0.4 U/A 7/25: Negative  He was previously seen for orchitis in September 2024.  He was treated with antibiotics and his symptoms resolved.  Past Medical History:  Past Medical History:  Diagnosis Date   Anxiety    Coarse tremors     Past Surgical History:  Past Surgical History:  Procedure Laterality Date   DENTAL SURGERY      Allergies:  Allergies  Allergen Reactions   Capsaicin Swelling    Family History:  Family History  Problem Relation Age of Onset   Arthritis/Rheumatoid Mother    Gout Mother    Arthritis Mother    Parkinson's disease Father    Diabetes Father    Bipolar disorder Father    Gallstones Sister    Hodgkin's lymphoma Sister    ADD / ADHD Son    ODD Son     Social History:  Social History   Tobacco Use   Smoking status: Every Day    Current packs/day: 0.25    Types: Cigarettes   Smokeless tobacco: Never  Vaping Use   Vaping status: Every Day   Substances: Nicotine  Substance Use Topics   Alcohol use: No    Comment: occ   Drug use: No    ROS: Constitutional:  Negative for fever, chills, weight loss CV: Negative for chest pain, previous MI,  hypertension Respiratory:  Negative for shortness of breath, wheezing, sleep apnea, frequent cough GI:  Negative for nausea, vomiting, bloody stool, GERD  Physical exam: BP 131/88   Pulse 78   Ht 5' 10 (1.778 m)   Wt 232 lb (105.2 kg)   BMI 33.29 kg/m  GENERAL APPEARANCE:  Well appearing, well developed, well nourished, NAD HEENT:  Atraumatic, normocephalic, oropharynx clear NECK:  Supple without lymphadenopathy or thyromegaly ABDOMEN:  Soft, non-tender, no masses EXTREMITIES:  Moves all extremities well, without clubbing, cyanosis, or edema NEUROLOGIC:  Alert and oriented x 3, normal gait, CN II-XII grossly intact MENTAL STATUS:  appropriate BACK:  Non-tender to palpation, No CVAT SKIN:  Warm, dry, and intact GU: Penis:  uncircumcised Meatus: Normal Scrotum: normal, no masses Testis: normal without masses bilateral  Results: U/A: Negative  Results for orders placed or performed in visit on 06/19/24 (from the past 24 hours)  Bladder Scan (Post Void Residual) in office   Collection Time: 06/19/24 10:15 AM  Result Value Ref Range   Scan Result 16mL

## 2024-07-20 ENCOUNTER — Encounter: Payer: Self-pay | Admitting: Urology

## 2024-07-20 ENCOUNTER — Ambulatory Visit: Admitting: Urology

## 2024-07-20 VITALS — BP 137/78 | HR 74 | Ht 70.0 in | Wt 226.0 lb

## 2024-07-20 DIAGNOSIS — R399 Unspecified symptoms and signs involving the genitourinary system: Secondary | ICD-10-CM

## 2024-07-20 DIAGNOSIS — R350 Frequency of micturition: Secondary | ICD-10-CM | POA: Diagnosis not present

## 2024-07-20 DIAGNOSIS — R351 Nocturia: Secondary | ICD-10-CM | POA: Diagnosis not present

## 2024-07-20 LAB — URINALYSIS, ROUTINE W REFLEX MICROSCOPIC
Bilirubin, UA: NEGATIVE
Glucose, UA: NEGATIVE
Ketones, UA: NEGATIVE
Leukocytes,UA: NEGATIVE
Nitrite, UA: NEGATIVE
Protein,UA: NEGATIVE
RBC, UA: NEGATIVE
Specific Gravity, UA: 1.025 (ref 1.005–1.030)
Urobilinogen, Ur: 0.2 mg/dL (ref 0.2–1.0)
pH, UA: 7 (ref 5.0–7.5)

## 2024-07-20 MED ORDER — GEMTESA 75 MG PO TABS
75.0000 mg | ORAL_TABLET | Freq: Every day | ORAL | 11 refills | Status: DC
Start: 2024-07-20 — End: 2024-08-03

## 2024-07-20 NOTE — Progress Notes (Signed)
   Assessment: 1. Lower urinary tract symptoms (LUTS)     Plan: Continue bladder diet  Continue Gemtesa  75 mg daily.  Samples and rx given. Consider a trial of Myrbetriq 50 mg daily if he is unable to get Gemtesa . Return to office in 3 months  Chief Complaint:  Chief Complaint  Patient presents with   LUTS    History of Present Illness:  Jake Walsh is a 38 y.o. male who is seen for further evaluation of lower urinary tract symptoms. At his initial visit in September 2025, he reported increased lower urinary tract symptoms for approximately 2 months.  His symptoms include increased daytime frequency voiding 10-20 times per day and nocturia 2-3 times.  He had bladder pressure prior to voiding and a sensation of incomplete emptying.  He was voiding with a good stream.  No dysuria or gross hematuria. IPSS = 15/4.  PSA 05/16/2024: 0.4 U/A 7/25: Negative  He was previously seen for orchitis in September 2024.  He was treated with antibiotics and his symptoms resolved.  He was instructed on a bladder diet and given a trial of Gemtesa . He returns today for follow-up. He has noted significant improvement in his urinary symptoms with Gemtesa .  His frequency has significantly improved as he is now voiding 3-4 times per day.  His nocturia has decreased to 1 time.  No side effects.  No dysuria or gross hematuria. IPSS = 6/1.  Portions of the above documentation were copied from a prior visit for review purposes only.   Past Medical History:  Past Medical History:  Diagnosis Date   Anxiety    Coarse tremors     Past Surgical History:  Past Surgical History:  Procedure Laterality Date   DENTAL SURGERY      Allergies:  Allergies  Allergen Reactions   Capsaicin Swelling    Family History:  Family History  Problem Relation Age of Onset   Arthritis/Rheumatoid Mother    Gout Mother    Arthritis Mother    Parkinson's disease Father    Diabetes Father    Bipolar disorder  Father    Gallstones Sister    Hodgkin's lymphoma Sister    ADD / ADHD Son    ODD Son     Social History:  Social History   Tobacco Use   Smoking status: Every Day    Current packs/day: 0.25    Types: Cigarettes   Smokeless tobacco: Never  Vaping Use   Vaping status: Every Day   Substances: Nicotine  Substance Use Topics   Alcohol use: No    Comment: occ   Drug use: No    ROS: Constitutional:  Negative for fever, chills, weight loss CV: Negative for chest pain, previous MI, hypertension Respiratory:  Negative for shortness of breath, wheezing, sleep apnea, frequent cough GI:  Negative for nausea, vomiting, bloody stool, GERD  Physical exam: BP 137/78   Pulse 74   Ht 5' 10 (1.778 m)   Wt 226 lb (102.5 kg)   BMI 32.43 kg/m  GENERAL APPEARANCE:  Well appearing, well developed, well nourished, NAD HEENT:  Atraumatic, normocephalic, oropharynx clear NECK:  Supple without lymphadenopathy or thyromegaly ABDOMEN:  Soft, non-tender, no masses EXTREMITIES:  Moves all extremities well, without clubbing, cyanosis, or edema NEUROLOGIC:  Alert and oriented x 3, normal gait, CN II-XII grossly intact MENTAL STATUS:  appropriate BACK:  Non-tender to palpation, No CVAT SKIN:  Warm, dry, and intact  Results: U/A: Negative

## 2024-08-03 ENCOUNTER — Other Ambulatory Visit: Payer: Self-pay | Admitting: Urology

## 2024-08-03 ENCOUNTER — Telehealth: Payer: Self-pay

## 2024-08-03 DIAGNOSIS — R399 Unspecified symptoms and signs involving the genitourinary system: Secondary | ICD-10-CM

## 2024-08-03 MED ORDER — MIRABEGRON ER 50 MG PO TB24: 50.0000 mg | ORAL_TABLET | Freq: Every day | ORAL | 11 refills | Status: AC

## 2024-08-03 NOTE — Telephone Encounter (Signed)
 Pt insurance will not cover Gemtesa . Please advise

## 2024-08-07 NOTE — Telephone Encounter (Signed)
 Pt made aware of new medication called in.

## 2024-09-14 ENCOUNTER — Telehealth: Payer: Self-pay

## 2024-09-14 NOTE — Telephone Encounter (Signed)
 PA for Gemtesa  was denied. Insurance stated pt must try and fail 2 other medications prior to covering Gemtesa . Pt currently has a rx for Myrbetriq . [facility] requesting a return call about if he has taken any other medications and/or if Myrbetriq  is working for him.  Insurance meds that must be tried first: Darifenacin ER, fesoterodine, mirabegron  tabs, oxybutynin solution, oxybutynin tabs, oxybutynin ER, solifenacin, tolterodine, tolterodine ER, trospium, or trspium ER

## 2024-10-26 ENCOUNTER — Encounter: Payer: Self-pay | Admitting: Urology

## 2024-10-26 ENCOUNTER — Ambulatory Visit: Admitting: Urology

## 2024-10-26 VITALS — BP 131/72 | HR 75 | Ht 70.0 in | Wt 232.0 lb

## 2024-10-26 DIAGNOSIS — R3915 Urgency of urination: Secondary | ICD-10-CM | POA: Diagnosis not present

## 2024-10-26 DIAGNOSIS — R3914 Feeling of incomplete bladder emptying: Secondary | ICD-10-CM

## 2024-10-26 DIAGNOSIS — R351 Nocturia: Secondary | ICD-10-CM | POA: Diagnosis not present

## 2024-10-26 DIAGNOSIS — R399 Unspecified symptoms and signs involving the genitourinary system: Secondary | ICD-10-CM

## 2024-10-26 LAB — URINALYSIS, ROUTINE W REFLEX MICROSCOPIC
Bilirubin, UA: NEGATIVE
Glucose, UA: NEGATIVE
Ketones, UA: NEGATIVE
Leukocytes,UA: NEGATIVE
Nitrite, UA: NEGATIVE
Protein,UA: NEGATIVE
RBC, UA: NEGATIVE
Specific Gravity, UA: 1.02 (ref 1.005–1.030)
Urobilinogen, Ur: 0.2 mg/dL (ref 0.2–1.0)
pH, UA: 7 (ref 5.0–7.5)

## 2024-10-26 NOTE — Progress Notes (Signed)
 "  Assessment: 1. Lower urinary tract symptoms (LUTS)     Plan: Continue bladder diet  Continue Myrbetriq  50 mg -he try taking this in the evening to see if this might improve his nocturia. Advised him to let me know if this works better for him.  If not, we can consider a trial of a different medication.  Insurance meds that must be tried first: Darifenacin ER, fesoterodine, mirabegron  tabs, oxybutynin solution, oxybutynin tabs, oxybutynin ER, solifenacin, tolterodine, tolterodine ER, trospium, or trspium ER     Return to office in 4 months  Chief Complaint:  Chief Complaint  Patient presents with   LUTS    History of Present Illness:  Jake Walsh is a 39 y.o. male who is seen for further evaluation of lower urinary tract symptoms. At his initial visit in September 2025, he reported increased lower urinary tract symptoms for approximately 2 months.  His symptoms include increased daytime frequency voiding 10-20 times per day and nocturia 2-3 times.  He had bladder pressure prior to voiding and a sensation of incomplete emptying.  He was voiding with a good stream.  No dysuria or gross hematuria. IPSS = 15/4.  PSA 05/16/2024: 0.4 U/A 7/25: Negative  He was previously seen for orchitis in September 2024.  He was treated with antibiotics and his symptoms resolved.  He was instructed on a bladder diet and given a trial of Gemtesa .  At his visit in October 2025, he noted significant improvement in his urinary symptoms with Gemtesa .  His frequency had significantly improved as he was voiding 3-4 times per day.  His nocturia decreased to 1 time.  No side effects.  No dysuria or gross hematuria. IPSS = 6/1. He was changed to Myrbetriq  50 mg daily in October 2025 due to insurance coverage.  He returns today for follow-up.  He continues on Myrbetriq  50 mg daily.  His lower urinary tract symptoms are stable.  He does have some urgency, sensation of incomplete emptying, nocturia x 2.   He does not feel like the Myrbetriq  is controlling his nighttime symptoms as well.  He is taking the Myrbetriq  in the morning.  No dysuria or gross hematuria. IPSS = 8/2.  Portions of the above documentation were copied from a prior visit for review purposes only.  Past Medical History:  Past Medical History:  Diagnosis Date   Anxiety    Coarse tremors     Past Surgical History:  Past Surgical History:  Procedure Laterality Date   DENTAL SURGERY      Allergies:  Allergies  Allergen Reactions   Capsaicin Swelling    Family History:  Family History  Problem Relation Age of Onset   Arthritis/Rheumatoid Mother    Gout Mother    Arthritis Mother    Parkinson's disease Father    Diabetes Father    Bipolar disorder Father    Gallstones Sister    Hodgkin's lymphoma Sister    ADD / ADHD Son    ODD Son     Social History:  Social History   Tobacco Use   Smoking status: Every Day    Current packs/day: 0.25    Types: Cigarettes   Smokeless tobacco: Never  Vaping Use   Vaping status: Every Day   Substances: Nicotine  Substance Use Topics   Alcohol use: No    Comment: occ   Drug use: No    ROS: Constitutional:  Negative for fever, chills, weight loss CV: Negative for chest pain,  previous MI, hypertension Respiratory:  Negative for shortness of breath, wheezing, sleep apnea, frequent cough GI:  Negative for nausea, vomiting, bloody stool, GERD  Physical exam: BP 131/72   Pulse 75   Ht 5' 10 (1.778 m)   Wt 232 lb (105.2 kg)   BMI 33.29 kg/m  GENERAL APPEARANCE:  Well appearing, well developed, well nourished, NAD HEENT:  Atraumatic, normocephalic, oropharynx clear NECK:  Supple without lymphadenopathy or thyromegaly ABDOMEN:  Soft, non-tender, no masses EXTREMITIES:  Moves all extremities well, without clubbing, cyanosis, or edema NEUROLOGIC:  Alert and oriented x 3, normal gait, CN II-XII grossly intact MENTAL STATUS:  appropriate BACK:  Non-tender to  palpation, No CVAT SKIN:  Warm, dry, and intact  Results: U/A: Negative "

## 2024-11-09 ENCOUNTER — Emergency Department (HOSPITAL_COMMUNITY)

## 2024-11-09 ENCOUNTER — Ambulatory Visit: Admitting: Nurse Practitioner

## 2024-11-09 ENCOUNTER — Encounter: Payer: Self-pay | Admitting: Nurse Practitioner

## 2024-11-09 ENCOUNTER — Ambulatory Visit (INDEPENDENT_AMBULATORY_CARE_PROVIDER_SITE_OTHER)

## 2024-11-09 ENCOUNTER — Encounter (HOSPITAL_COMMUNITY): Payer: Self-pay

## 2024-11-09 ENCOUNTER — Emergency Department (HOSPITAL_COMMUNITY)
Admission: EM | Admit: 2024-11-09 | Discharge: 2024-11-09 | Discharge status: Against Medical Advice | Attending: Emergency Medicine | Admitting: Emergency Medicine

## 2024-11-09 VITALS — BP 137/84 | HR 77 | Temp 98.2°F | Ht 70.0 in | Wt 232.0 lb

## 2024-11-09 DIAGNOSIS — R1032 Left lower quadrant pain: Secondary | ICD-10-CM

## 2024-11-09 DIAGNOSIS — R109 Unspecified abdominal pain: Secondary | ICD-10-CM

## 2024-11-09 DIAGNOSIS — Z5321 Procedure and treatment not carried out due to patient leaving prior to being seen by health care provider: Secondary | ICD-10-CM | POA: Diagnosis not present

## 2024-11-09 HISTORY — DX: Diverticulitis of intestine, part unspecified, without perforation or abscess without bleeding: K57.92

## 2024-11-09 LAB — CBC
HCT: 43.3 % (ref 39.0–52.0)
Hemoglobin: 14.6 g/dL (ref 13.0–17.0)
MCH: 28.5 pg (ref 26.0–34.0)
MCHC: 33.7 g/dL (ref 30.0–36.0)
MCV: 84.4 fL (ref 80.0–100.0)
Platelets: 245 10*3/uL (ref 150–400)
RBC: 5.13 MIL/uL (ref 4.22–5.81)
RDW: 13.4 % (ref 11.5–15.5)
WBC: 8 10*3/uL (ref 4.0–10.5)
nRBC: 0 % (ref 0.0–0.2)

## 2024-11-09 LAB — COMPREHENSIVE METABOLIC PANEL WITH GFR
ALT: 20 U/L (ref 0–44)
AST: 20 U/L (ref 15–41)
Albumin: 4.6 g/dL (ref 3.5–5.0)
Alkaline Phosphatase: 88 U/L (ref 38–126)
Anion gap: 16 — ABNORMAL HIGH (ref 5–15)
BUN: 13 mg/dL (ref 6–20)
CO2: 22 mmol/L (ref 22–32)
Calcium: 9.6 mg/dL (ref 8.9–10.3)
Chloride: 101 mmol/L (ref 98–111)
Creatinine, Ser: 0.74 mg/dL (ref 0.61–1.24)
GFR, Estimated: >60 mL/min
Glucose, Bld: 92 mg/dL (ref 70–99)
Potassium: 3.8 mmol/L (ref 3.5–5.1)
Sodium: 139 mmol/L (ref 135–145)
Total Bilirubin: 0.3 mg/dL (ref 0.0–1.2)
Total Protein: 7.6 g/dL (ref 6.5–8.1)

## 2024-11-09 LAB — LIPASE, BLOOD: Lipase: 18 U/L (ref 11–51)

## 2024-11-09 LAB — TYPE AND SCREEN
ABO/RH(D): A POS
Antibody Screen: NEGATIVE

## 2024-11-09 LAB — MAGNESIUM: Magnesium: 1.9 mg/dL (ref 1.7–2.4)

## 2024-11-09 NOTE — ED Triage Notes (Signed)
 Pt comes in after seeing his PCP. Pt was just dx with diverticulitis. Pt had a BM this morning when he saw blood in his stool. Pt does have abd pain in the LLQ. Pain has been going on for 2 days.   A&Ox4 in triage.

## 2024-11-09 NOTE — Patient Instructions (Signed)
 Inflamed Colon (Diverticulitis): What to Know  Diverticulitis is when small pouches in your colon get infected or swollen. This causes pain in your belly (abdomen) and watery poop (diarrhea). The small pouches are called diverticula. They may form if you have a condition called diverticulosis. What are the causes? You may get this condition if poop (stool) gets trapped in the pouches in your colon. The poop lets germs (bacteria) grow. This causes an infection. What increases the risk? You are more likely to get this condition if you have small pouches in your colon. You are also more likely to get it if: You are overweight or very overweight (obese). You do not exercise enough. You drink alcohol. You smoke. You eat a lot of red meat, like beef, pork, or lamb. You do not eat enough fiber. You are older than 36 years of age. What are the signs or symptoms? Pain in your belly. Pain is often on the left side, but it may be felt in other spots too. Fever and chills. Feeling like you may vomit. Vomiting. Having cramps. Feeling full. Changes in how often you poop. Blood in your poop. How is this treated? Most cases are treated at home. You may be told to: Take over-the-counter pain medicines. Only eat and drink clear liquids. Take antibiotics. Rest. Very bad cases may need to be treated at a hospital. Treatment may include: Not eating or drinking. Taking pain medicines. Getting antibiotics through an IV tube. Getting fluid and food through an IV tube. Having surgery. When you are feeling better, you may need to have a test to look at your colon (colonoscopy). Follow these instructions at home: Medicines Take over-the-counter and prescription medicines only as told by your doctor. These include: Fiber pills. Probiotics. Medicines to make your poop soft (stool softeners). If you were prescribed antibiotics, take them as told by your doctor. Do not stop taking them even if you start  to feel better. Ask your doctor if you should avoid driving or using machines while you are taking your medicine. Eating and drinking  Follow the diet told by your doctor. You may need to only eat and drink liquids. When you feel better, you may be able to eat more foods. You may also be told to eat a lot of fiber. Fiber helps you poop. Foods with fiber include berries, beans, lentils, and green vegetables. Try not to eat red meat. General instructions Do not smoke or use any products that contain nicotine or tobacco. If you need help quitting, ask your doctor. Exercise 3 or more times a week. Try to go for 30 minutes each time. Exercise enough to sweat and make your heart beat faster. Contact a doctor if: Your pain gets worse. You are not pooping like normal. Your symptoms do not get better. Your symptoms get worse very fast. You have a fever. You vomit more than one time. You have poop that is: Bloody. Black. Tarry. This information is not intended to replace advice given to you by your health care provider. Make sure you discuss any questions you have with your health care provider. Document Revised: 08/05/2024 Document Reviewed: 06/24/2022 Elsevier Patient Education  2025 [employer].

## 2024-11-09 NOTE — Progress Notes (Signed)
" ° °  Subjective:    Patient ID: Jake Walsh, male    DOB: [birth year], 39 y.o.   MRN: [id number]   Chief Complaint: Blood In Stools (ABdominal pain started yesterday and had blood in stool today)   HPI  Patient in c/o abdominal pain that started yesterday. Describes pain as stabbing on left lower quadrant. Rates 7/10. No nausea and vomiting. No change in appetite. Denies constipation. Had dark red blood in stool.   Patient Active Problem List   Diagnosis Date Noted   Lower urinary tract symptoms (LUTS) 06/19/2024   Hypertension 03/26/2024   Class 1 obesity with body mass index (BMI) of 31.0 to 31.9 in adult 03/26/2024       Review of Systems  Constitutional:  Negative for chills and fever.  Gastrointestinal:  Positive for abdominal pain and blood in stool. Negative for constipation, diarrhea, nausea and vomiting.       Objective:   Physical Exam Constitutional:      Appearance: Normal appearance.  Cardiovascular:     Rate and Rhythm: Normal rate and regular rhythm.     Heart sounds: Normal heart sounds.  Pulmonary:     Effort: Pulmonary effort is normal.     Breath sounds: Normal breath sounds.  Abdominal:     General: Abdomen is flat. Bowel sounds are normal.     Palpations: Abdomen is soft.     Tenderness: There is abdominal tenderness (left lower quadrant pain on palpation).  Skin:    General: Skin is warm.  Neurological:     General: No focal deficit present.     Mental Status: He is alert and oriented to person, place, and time.  Psychiatric:        Mood and Affect: Mood normal.        Behavior: Behavior normal.    BP 137/84   Pulse 77   Temp 98.2 F (36.8 C) (Temporal)   Ht 5' 10 (1.778 m)   Wt 232 lb (105.2 kg)   SpO2 97%   BMI 33.29 kg/m    KUB- Moderate stool burden throughout colon      Assessment & Plan:   Elsie VEAR Brain in today with chief complaint of Blood In Stools (ABdominal pain started yesterday and had blood in stool  today)   1. Abdominal pain, unspecified abdominal location (Primary) - DG Abd 1 View  2. Left lower quadrant abdominal pain Stat ct- will call with results - CT ABDOMEN PELVIS W CONTRAST; Future    The above assessment and management plan was discussed with the patient. The patient verbalized understanding of and has agreed to the management plan. Patient is aware to call the clinic if symptoms persist or worsen. Patient is aware when to return to the clinic for a follow-up visit. Patient educated on when it is appropriate to go to the emergency department.   Mary-Margaret Gladis, FNP   "

## 2024-11-10 ENCOUNTER — Ambulatory Visit (HOSPITAL_BASED_OUTPATIENT_CLINIC_OR_DEPARTMENT_OTHER)

## 2024-11-12 ENCOUNTER — Ambulatory Visit: Payer: Self-pay | Admitting: Nurse Practitioner

## 2024-11-12 ENCOUNTER — Ambulatory Visit (HOSPITAL_COMMUNITY)
Admission: RE | Admit: 2024-11-12 | Discharge: 2024-11-12 | Attending: Nurse Practitioner | Admitting: Nurse Practitioner

## 2024-11-12 DIAGNOSIS — R1032 Left lower quadrant pain: Secondary | ICD-10-CM

## 2024-11-12 DIAGNOSIS — K5732 Diverticulitis of large intestine without perforation or abscess without bleeding: Secondary | ICD-10-CM

## 2024-11-12 MED ORDER — IOHEXOL 300 MG/ML  SOLN
100.0000 mL | Freq: Once | INTRAMUSCULAR | Status: AC | PRN
  Administered 2024-11-12: 100 mL via INTRAVENOUS

## 2024-11-12 MED ORDER — IOHEXOL 9 MG/ML PO SOLN
500.0000 mL | ORAL | Status: AC
  Administered 2024-11-12: 500 mL via ORAL

## 2024-11-12 MED ORDER — IOHEXOL 9 MG/ML PO SOLN
ORAL | Status: AC
  Filled 2024-11-12: qty 1000

## 2024-11-13 MED ORDER — CIPROFLOXACIN HCL 500 MG PO TABS: 500.0000 mg | ORAL_TABLET | Freq: Two times a day (BID) | ORAL | 0 refills | Status: AC

## 2024-11-13 MED ORDER — METRONIDAZOLE 500 MG PO TABS: 500.0000 mg | ORAL_TABLET | Freq: Three times a day (TID) | ORAL | 0 refills | Status: AC

## 2025-03-01 ENCOUNTER — Ambulatory Visit: Admitting: Urology
